# Patient Record
Sex: Male | Born: 1965 | Race: Black or African American | Hispanic: No | Smoking: Current every day smoker
Health system: Southern US, Community
[De-identification: ages and names within clinical notes are randomized; demographics above are authoritative.]

---

## 2019-08-25 ENCOUNTER — Other Ambulatory Visit: Payer: Self-pay

## 2019-08-25 ENCOUNTER — Encounter: Payer: Self-pay | Admitting: Emergency Medicine

## 2019-08-25 ENCOUNTER — Emergency Department
Admission: EM | Admit: 2019-08-25 | Discharge: 2019-08-26 | Disposition: A | Discharge status: Home / Self Care | Payer: Self-pay | Attending: Emergency Medicine | Admitting: Emergency Medicine

## 2019-08-25 DIAGNOSIS — F251 Schizoaffective disorder, depressive type: Secondary | ICD-10-CM | POA: Diagnosis present

## 2019-08-25 DIAGNOSIS — R4182 Altered mental status, unspecified: Secondary | ICD-10-CM | POA: Insufficient documentation

## 2019-08-25 DIAGNOSIS — F323 Major depressive disorder, single episode, severe with psychotic features: Secondary | ICD-10-CM | POA: Diagnosis present

## 2019-08-25 DIAGNOSIS — R440 Auditory hallucinations: Secondary | ICD-10-CM | POA: Diagnosis present

## 2019-08-25 DIAGNOSIS — F1424 Cocaine dependence with cocaine-induced mood disorder: Secondary | ICD-10-CM | POA: Insufficient documentation

## 2019-08-25 DIAGNOSIS — Z046 Encounter for general psychiatric examination, requested by authority: Secondary | ICD-10-CM | POA: Insufficient documentation

## 2019-08-25 DIAGNOSIS — F14988 Cocaine use, unspecified with other cocaine-induced disorder: Secondary | ICD-10-CM

## 2019-08-25 DIAGNOSIS — F17220 Nicotine dependence, chewing tobacco, uncomplicated: Secondary | ICD-10-CM | POA: Insufficient documentation

## 2019-08-25 DIAGNOSIS — F19951 Other psychoactive substance use, unspecified with psychoactive substance-induced psychotic disorder with hallucinations: Secondary | ICD-10-CM | POA: Diagnosis present

## 2019-08-25 DIAGNOSIS — Z20822 Contact with and (suspected) exposure to covid-19: Secondary | ICD-10-CM | POA: Insufficient documentation

## 2019-08-25 LAB — CBC WITH DIFFERENTIAL/PLATELET
Abs Immature Granulocytes: 0.02 10*3/uL (ref 0.00–0.07)
Basophils Absolute: 0 10*3/uL (ref 0.0–0.1)
Basophils Relative: 1 %
Eosinophils Absolute: 0 10*3/uL (ref 0.0–0.5)
Eosinophils Relative: 1 %
HCT: 44.5 % (ref 39.0–52.0)
Hemoglobin: 14.8 g/dL (ref 13.0–17.0)
Immature Granulocytes: 0 %
Lymphocytes Relative: 34 %
Lymphs Abs: 1.7 10*3/uL (ref 0.7–4.0)
MCH: 27.8 pg (ref 26.0–34.0)
MCHC: 33.3 g/dL (ref 30.0–36.0)
MCV: 83.5 fL (ref 80.0–100.0)
Monocytes Absolute: 0.4 10*3/uL (ref 0.1–1.0)
Monocytes Relative: 7 %
Neutro Abs: 3 10*3/uL (ref 1.7–7.7)
Neutrophils Relative %: 57 %
Platelets: 202 10*3/uL (ref 150–400)
RBC: 5.33 MIL/uL (ref 4.22–5.81)
RDW: 12.9 % (ref 11.5–15.5)
WBC: 5.2 10*3/uL (ref 4.0–10.5)
nRBC: 0 % (ref 0.0–0.2)

## 2019-08-25 LAB — COMPREHENSIVE METABOLIC PANEL
ALT: 49 U/L — ABNORMAL HIGH (ref 0–44)
AST: 80 U/L — ABNORMAL HIGH (ref 15–41)
Albumin: 4.5 g/dL (ref 3.5–5.0)
Alkaline Phosphatase: 66 U/L (ref 38–126)
Anion gap: 11 (ref 5–15)
BUN: 14 mg/dL (ref 6–20)
CO2: 27 mmol/L (ref 22–32)
Calcium: 9.1 mg/dL (ref 8.9–10.3)
Chloride: 100 mmol/L (ref 98–111)
Creatinine, Ser: 1.19 mg/dL (ref 0.61–1.24)
GFR calc Af Amer: >60 mL/min (ref >60)
GFR calc non Af Amer: >60 mL/min (ref >60)
Glucose, Bld: 115 mg/dL — ABNORMAL HIGH (ref 70–99)
Potassium: 4.1 mmol/L (ref 3.5–5.1)
Sodium: 138 mmol/L (ref 135–145)
Total Bilirubin: 1 mg/dL (ref 0.3–1.2)
Total Protein: 7.8 g/dL (ref 6.5–8.1)

## 2019-08-25 LAB — URINE DRUG SCREEN, QUALITATIVE (ARMC ONLY)
Amphetamines, Ur Screen: NOT DETECTED
Barbiturates, Ur Screen: NOT DETECTED
Benzodiazepine, Ur Scrn: NOT DETECTED
Cannabinoid 50 Ng, Ur ~~LOC~~: NOT DETECTED
Cocaine Metabolite,Ur ~~LOC~~: POSITIVE — AB
MDMA (Ecstasy)Ur Screen: NOT DETECTED
Methadone Scn, Ur: NOT DETECTED
Opiate, Ur Screen: NOT DETECTED
Phencyclidine (PCP) Ur S: NOT DETECTED
Tricyclic, Ur Screen: NOT DETECTED

## 2019-08-25 LAB — URINALYSIS, COMPLETE (UACMP) WITH MICROSCOPIC
Bacteria, UA: NONE SEEN
Bilirubin Urine: NEGATIVE
Glucose, UA: NEGATIVE mg/dL
Hgb urine dipstick: NEGATIVE
Ketones, ur: NEGATIVE mg/dL
Leukocytes,Ua: NEGATIVE
Nitrite: NEGATIVE
Protein, ur: NEGATIVE mg/dL
Specific Gravity, Urine: 1.006 (ref 1.005–1.030)
Squamous Epithelial / HPF: NONE SEEN (ref 0–5)
pH: 6 (ref 5.0–8.0)

## 2019-08-25 LAB — SALICYLATE LEVEL: Salicylate Lvl: <7 mg/dL — ABNORMAL LOW (ref 7.0–30.0)

## 2019-08-25 LAB — ETHANOL: Alcohol, Ethyl (B): <10 mg/dL (ref <10)

## 2019-08-25 LAB — LIPASE, BLOOD: Lipase: 21 U/L (ref 11–51)

## 2019-08-25 LAB — ACETAMINOPHEN LEVEL: Acetaminophen (Tylenol), Serum: <10 ug/mL — ABNORMAL LOW (ref 10–30)

## 2019-08-25 MED ORDER — LORAZEPAM 2 MG/ML IJ SOLN: 2.0000 mg | Freq: Once | INTRAMUSCULAR | Status: AC

## 2019-08-25 MED ORDER — HALOPERIDOL LACTATE 5 MG/ML IJ SOLN
INTRAMUSCULAR | Status: AC
  Filled 2019-08-25: qty 1

## 2019-08-25 MED ORDER — DIPHENHYDRAMINE HCL 50 MG/ML IJ SOLN
50.0000 mg | Freq: Once | INTRAMUSCULAR | Status: AC
  Administered 2019-08-25: 50 mg via INTRAVENOUS
  Filled 2019-08-25: qty 1

## 2019-08-25 MED ORDER — LORAZEPAM 2 MG/ML IJ SOLN
INTRAMUSCULAR | Status: AC
  Administered 2019-08-25: 2 mg via INTRAVENOUS
  Filled 2019-08-25: qty 1

## 2019-08-25 MED ORDER — HALOPERIDOL LACTATE 5 MG/ML IJ SOLN
5.0000 mg | Freq: Once | INTRAMUSCULAR | Status: AC
  Administered 2019-08-25: 5 mg via INTRAVENOUS

## 2019-08-25 MED ORDER — SODIUM CHLORIDE 0.9 % IV BOLUS
1000.0000 mL | Freq: Once | INTRAVENOUS | Status: AC
  Administered 2019-08-25: 1000 mL via INTRAVENOUS

## 2019-08-25 NOTE — ED Notes (Signed)
Pt still trying to get out of bed.  Pt grabbing at air for objects that are not there.  Unable to understand pt speech.

## 2019-08-25 NOTE — ED Notes (Signed)
Checked on pt. Remains asleep in room.  NAD. Pulse ox remains on pt.

## 2019-08-25 NOTE — ED Notes (Signed)
Per Annice Pih NP, pt to be admitted to beh med unit when bed is available.
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
## 2019-08-25 NOTE — ED Notes (Addendum)
Pt resting comfortably with eyes closed, no distress noted. Pt awaiting to be evaluated by psych.

## 2019-08-25 NOTE — ED Notes (Signed)
Pt seems paranoid, states "someone is trying to use my information".  Keeps having jerking motions to whole body in bed.  Oriented to time, place, and person.  Denies drug use.  Pt has been seen at [facility] for drug and alcohol abuse per care everywhere.  Pt has to be reminded repeatedly of directions given such as using urinal; will fall asleep then start jerking motions again.

## 2019-08-25 NOTE — ED Notes (Signed)
Report given to [employer], pt moved to 23

## 2019-08-25 NOTE — ED Notes (Signed)
Attempted to wake pt. Wakes to loud voice.  When asked pt if he knew where he was he moaned and fell back asleep. Remains in NAD.
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
## 2019-08-25 NOTE — ED Notes (Signed)
Pt still sleeping. Snoring heard, equal unlabored respirations noted.
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
## 2019-08-25 NOTE — ED Notes (Signed)
TTS in progress 

## 2019-08-25 NOTE — ED Notes (Signed)
Pt up to bathroom co sore throat and cough. Psych in to eval pt.

## 2019-08-25 NOTE — ED Notes (Signed)
Pt repeatedly getting out of bed. Will not leave yellow socks on.  Is not redirectable.  Heather RN and Animal nutritionist at bedside to assist this RN. Dr Scotty Court called to room. Officer phillips as bedside.  Pt keeps refusing to get back in bed. Appears to still be having hallucinations and unable to understand most of speech.

## 2019-08-26 DIAGNOSIS — F251 Schizoaffective disorder, depressive type: Secondary | ICD-10-CM | POA: Diagnosis present

## 2019-08-26 DIAGNOSIS — F323 Major depressive disorder, single episode, severe with psychotic features: Secondary | ICD-10-CM | POA: Diagnosis present

## 2019-08-26 DIAGNOSIS — R440 Auditory hallucinations: Secondary | ICD-10-CM | POA: Diagnosis present

## 2019-08-26 DIAGNOSIS — F19951 Other psychoactive substance use, unspecified with psychoactive substance-induced psychotic disorder with hallucinations: Secondary | ICD-10-CM | POA: Diagnosis present

## 2019-08-26 LAB — RESPIRATORY PANEL BY RT PCR (FLU A&B, COVID)
Influenza A by PCR: NEGATIVE
Influenza B by PCR: NEGATIVE
SARS Coronavirus 2 by RT PCR: NEGATIVE

## 2019-08-26 NOTE — Consult Note (Signed)
[facility]   Reason for Consult: Anxiety Referring Physician: Dr. Scotty CourtStafford Patient Identification: Kenneth DoingDavid Earl Yang MRN:  [id number] Principal Diagnosis: <principal problem not specified> Diagnosis:  Active Problems:   Major depression with psychotic features (HCC)   Schizoaffective disorder, depressive type (HCC)   Substance-induced psychotic disorder with hallucinations (HCC)   Auditory hallucinations   Total Time spent with patient: 30 minutes  Subjective: "You need to get out of here right now." Kenneth Yang is a 54 y.o. male patient [facility] via EMS voluntarily, with a past medical history of drug-induced psychosis who came in due to anxiousness, involuntary muscle twitching.  The patient was last seen at [facility] on 08/01/18 for a physical assessment.  The patient was not cooperative during his assessment.  He was challenging to understand and inconsistent with his information.  The patient denies drug use "somebody put something in my drink."    When asked why he presents to the emergency department, he responds, "I don't know." He also states that he is unaware of how he got to the ER. The patient was able to identify that he is very anxious. He "states I'm having a problem breathing and swallowing." He denies any history of medication management currently.  The patient did admit that he has spend some time inpatient at [facility].  He also disclosed that he was once prescribed Risperdal but could not elaborate on the dosage or any other medication.  He did admit that he often feels as if people are watching him or out to get him. He shares that his paranoia is usually only when he is intoxicated.  The patient was seen face-to-face by this provider; chart reviewed and consulted with Dr. Derrill KayGoodman on 08/25/2019 due to the patient's care. It was discussed with the EDP that the patient does meet the criteria to be  admitted to the psychiatric inpatient unit.  The patient is alert and oriented x 3, angry, irritated, uncooperative, and mood-congruent with affect on evaluation.  The patient does appear to be responding to internal and external stimuli. He is presenting with some delusional thinking. The patient denies auditory or visual hallucinations. The patient denies suicidal, homicidal, or self-harm ideations. The patient is presenting with psychotic and paranoid behaviors. During an encounter with the patient, he refused to answer most questions raised to him.  Plan: The patient is a safety risk to self and does require psychiatric inpatient admission for stabilization and treatment. HPI: Per Dr. Scotty CourtStafford: Kenneth Yang is a 54 y.o. male with a past medical history of drug-induced psychosis who comes to the ED by EMS due to anxiousness, involuntary muscle twitching.  States that he was hanging out with some new friends last night, and thinks they might have slipped him something.  He is evasive with questioning, denies drug use but reports high suspicion of being given drugs.  When asked for more details he says "I do not want to get him in trouble."  Denies any focal pain.  Reports an alcohol use and smoking.  Denies shortness of breath or headache.   Past Psychiatric History:   Risk to Self: Suicidal Ideation: No Suicidal Intent: No Is patient at risk for suicide?: No, but patient needs Medical Clearance Suicidal Plan?: No Access to Means: No How many times?: 0 Other Self Harm Risks: drug use  Triggers for Past Attempts: None known Intentional Self Injurious Behavior: None Risk to Others: Homicidal  Ideation: No Thoughts of Harm to Others: No Current Homicidal Intent: No Current Homicidal Plan: No Access to Homicidal Means: No History of harm to others?: No Does patient have access to weapons?: No Criminal Charges Pending?: No Prior Inpatient Therapy:   Prior Outpatient Therapy: Prior  Outpatient Therapy: No Does patient have an ACCT team?: No Does patient have Intensive In-House Services?  : No Does patient have [facility] services? : No Does patient have P4CC services?: No  Past Medical History: No past medical history on file.  Family History: No family history on file. Family Psychiatric  History: None provided Social History:  Social History   Substance and Sexual Activity  Alcohol Use Yes     Social History   Substance and Sexual Activity  Drug Use Never    Social History   Socioeconomic History  . Marital status: Single    Spouse name: Not on file  . Number of children: Not on file  . Years of education: Not on file  . Highest education level: Not on file  Occupational History  . Not on file  Tobacco Use  . Smoking status: Current Every Day Smoker    Types: E-cigarettes  . Smokeless tobacco: Never Used  Substance and Sexual Activity  . Alcohol use: Yes  . Drug use: Never  . Sexual activity: Not on file  Other Topics Concern  . Not on file  Social History Narrative  . Not on file   Social Determinants of Health   Financial Resource Strain:   . Difficulty of Paying Living Expenses:   Food Insecurity:   . Worried About Programme researcher, broadcasting/film/video in the Last Year:   . Barista in the Last Year:   Transportation Needs:   . Freight forwarder (Medical):   Marland Kitchen Lack of Transportation (Non-Medical):   Physical Activity:   . Days of Exercise per Week:   . Minutes of Exercise per Session:   Stress:   . Feeling of Stress :   Social Connections:   . Frequency of Communication with Friends and Family:   . Frequency of Social Gatherings with Friends and Family:   . Attends Religious Services:   . Active Member of Clubs or Organizations:   . Attends Banker Meetings:   Marland Kitchen Marital Status:    Additional Social History:    Allergies:   Allergies  Allergen Reactions  . Risperidone Other (See Comments)    Tongue EPS, drooling     Labs:  Results for orders placed or performed during the hospital encounter of 08/25/19 (from the past 48 hour(s))  Acetaminophen level     Status: Abnormal   Collection Time: 08/25/19  8:02 AM  Result Value Ref Range   Acetaminophen (Tylenol), Serum <10 (L) 10 - 30 ug/mL    Comment: (NOTE) Therapeutic concentrations vary significantly. A range of 10-30 ug/mL  may be an effective concentration for many patients. However, some  are best treated at concentrations outside of this range. Acetaminophen concentrations >150 ug/mL at 4 hours after ingestion  and >50 ug/mL at 12 hours after ingestion are often associated with  toxic reactions. Performed at [facility], [address] [place], [place] [address]   Comprehensive metabolic panel     Status: Abnormal   Collection Time: 08/25/19  8:02 AM  Result Value Ref Range   Sodium 138 135 - 145 mmol/L   Potassium 4.1 3.5 - 5.1 mmol/L   Chloride 100 98 -  111 mmol/L   CO2 27 22 - 32 mmol/L   Glucose, Bld 115 (H) 70 - 99 mg/dL    Comment: Glucose reference range applies only to samples taken after fasting for at least 8 hours.   BUN 14 6 - 20 mg/dL   Creatinine, Ser 7.06 0.61 - 1.24 mg/dL   Calcium 9.1 8.9 - 23.7 mg/dL   Total Protein 7.8 6.5 - 8.1 g/dL   Albumin 4.5 3.5 - 5.0 g/dL   AST 80 (H) 15 - 41 U/L   ALT 49 (H) 0 - 44 U/L   Alkaline Phosphatase 66 38 - 126 U/L   Total Bilirubin 1.0 0.3 - 1.2 mg/dL   GFR calc non Af Amer >60 >60 mL/min   GFR calc Af Amer >60 >60 mL/min   Anion gap 11 5 - 15    Comment: Performed at [facility], [address] [place], [place] [address]  Ethanol     Status: None   Collection Time: 08/25/19  8:02 AM  Result Value Ref Range   Alcohol, Ethyl (B) <10 <10 mg/dL    Comment: (NOTE) Lowest detectable limit for serum alcohol is 10 mg/dL. For medical purposes only. Performed at [facility], [address] [place], [place] [address]   Lipase, blood     Status:  None   Collection Time: 08/25/19  8:02 AM  Result Value Ref Range   Lipase 21 11 - 51 U/L    Comment: Performed at [facility], [address] [place], [place] [address]  Salicylate level     Status: Abnormal   Collection Time: 08/25/19  8:02 AM  Result Value Ref Range   Salicylate Lvl <7.0 (L) 7.0 - 30.0 mg/dL    Comment: Performed at [facility], [address] [place], [place] [address]  CBC with Differential     Status: None   Collection Time: 08/25/19  8:02 AM  Result Value Ref Range   WBC 5.2 4.0 - 10.5 K/uL   RBC 5.33 4.22 - 5.81 MIL/uL   Hemoglobin 14.8 13.0 - 17.0 g/dL   HCT 94.8 54.6 - 27.0 %   MCV 83.5 80.0 - 100.0 fL   MCH 27.8 26.0 - 34.0 pg   MCHC 33.3 30.0 - 36.0 g/dL   RDW 35.0 09.3 - 81.8 %   Platelets 202 150 - 400 K/uL   nRBC 0.0 0.0 - 0.2 %   Neutrophils Relative % 57 %   Neutro Abs 3.0 1.7 - 7.7 K/uL   Lymphocytes Relative 34 %   Lymphs Abs 1.7 0.7 - 4.0 K/uL   Monocytes Relative 7 %   Monocytes Absolute 0.4 0.1 - 1.0 K/uL   Eosinophils Relative 1 %   Eosinophils Absolute 0.0 0.0 - 0.5 K/uL   Basophils Relative 1 %   Basophils Absolute 0.0 0.0 - 0.1 K/uL   Immature Granulocytes 0 %   Abs Immature Granulocytes 0.02 0.00 - 0.07 K/uL    Comment: Performed at [facility], [address] [place], [place] [address]  Urinalysis, Complete w Microscopic     Status: Abnormal   Collection Time: 08/25/19  8:45 AM  Result Value Ref Range   Color, Urine YELLOW (A) YELLOW   APPearance CLEAR (A) CLEAR   Specific Gravity, Urine 1.006 1.005 - 1.030   pH 6.0 5.0 - 8.0   Glucose, UA NEGATIVE NEGATIVE mg/dL   Hgb urine dipstick NEGATIVE NEGATIVE   Bilirubin Urine NEGATIVE NEGATIVE  Ketones, ur NEGATIVE NEGATIVE mg/dL   Protein, ur NEGATIVE NEGATIVE mg/dL   Nitrite NEGATIVE NEGATIVE   Leukocytes,Ua NEGATIVE NEGATIVE   RBC / HPF 0-5 0 - 5 RBC/hpf   WBC, UA 0-5 0 - 5 WBC/hpf   Bacteria, UA NONE SEEN NONE SEEN   Squamous Epithelial  / LPF NONE SEEN 0 - 5   Mucus PRESENT     Comment: Performed at [facility], [address] [place], [place] [address]  Urine Drug Screen, Qualitative     Status: Abnormal   Collection Time: 08/25/19  8:45 AM  Result Value Ref Range   Tricyclic, Ur Screen NONE DETECTED NONE DETECTED   Amphetamines, Ur Screen NONE DETECTED NONE DETECTED   MDMA (Ecstasy)Ur Screen NONE DETECTED NONE DETECTED   Cocaine Metabolite,Ur [place] POSITIVE (A) NONE DETECTED   Opiate, Ur Screen NONE DETECTED NONE DETECTED   Phencyclidine (PCP) Ur S NONE DETECTED NONE DETECTED   Cannabinoid 50 Ng, Ur [place] NONE DETECTED NONE DETECTED   Barbiturates, Ur Screen NONE DETECTED NONE DETECTED   Benzodiazepine, Ur Scrn NONE DETECTED NONE DETECTED   Methadone Scn, Ur NONE DETECTED NONE DETECTED    Comment: (NOTE) Tricyclics + metabolites, urine    Cutoff 1000 ng/mL Amphetamines + metabolites, urine  Cutoff 1000 ng/mL MDMA (Ecstasy), urine              Cutoff 500 ng/mL Cocaine Metabolite, urine          Cutoff 300 ng/mL Opiate + metabolites, urine        Cutoff 300 ng/mL Phencyclidine (PCP), urine         Cutoff 25 ng/mL Cannabinoid, urine                 Cutoff 50 ng/mL Barbiturates + metabolites, urine  Cutoff 200 ng/mL Benzodiazepine, urine              Cutoff 200 ng/mL Methadone, urine                   Cutoff 300 ng/mL The urine drug screen provides only a preliminary, unconfirmed analytical test result and should not be used for non-medical purposes. Clinical consideration and professional judgment should be applied to any positive drug screen result due to possible interfering substances. A more specific alternate chemical method must be used in order to obtain a confirmed analytical result. Gas chromatography / mass spectrometry (GC/MS) is the preferred confirmat ory method. Performed at [facility], [address] [place], [place] [address]     No current facility-administered medications  for this encounter.   No current outpatient medications on file.    Musculoskeletal: Strength & Muscle Tone: within normal limits Gait & Station: normal Patient leans: N/A  Psychiatric Specialty Exam: Physical Exam  Nursing note and vitals reviewed. Constitutional: He is oriented to person, place, and time. He appears well-developed and well-nourished.  Cardiovascular: Normal rate.  Respiratory: Effort normal.  Musculoskeletal:        General: Normal range of motion.     Cervical back: Normal range of motion and neck supple.  Neurological: He is alert and oriented to person, place, and time.    Review of Systems  Psychiatric/Behavioral: Positive for agitation, behavioral problems, confusion and hallucinations. The patient is nervous/anxious.   All other systems reviewed and are negative.   Blood pressure 114/88, pulse 80, temperature 98.5 F (36.9 C), temperature source Oral, resp. rate 18, weight 81.6 kg, SpO2 93 %.There is no  height or weight on file to calculate BMI.  General Appearance: Bizarre and Guarded  Eye Contact:  Poor  Speech:  Pressured  Volume:  Increased  Mood:  Angry, Anxious, Euphoric and Irritable  Affect:  Congruent, Inappropriate and Full Range  Thought Process:  Disorganized  Orientation:  Full (Time, Place, and Person)  Thought Content:  Illogical, Delusions and Paranoid Ideation  Suicidal Thoughts:  No  Homicidal Thoughts:  No  Memory:  Immediate;   Poor Recent;   Poor Remote;   Poor  Judgement:  Impaired  Insight:  Lacking  Psychomotor Activity:  Increased  Concentration:  Concentration: Poor and Attention Span: Poor  Recall:  Poor  Fund of Knowledge:  Poor  Language:  Fair  Akathisia:  Negative  Handed:  Right  AIMS (if indicated):     Assets:  Communication Skills Desire for Improvement Financial Resources/Insurance Housing Resilience Social Support  ADL's:  Intact  Cognition:  Impaired,  Mild  Sleep:    Okay     Treatment Plan  Summary: Plan Patient meets criteria for psychiatric inpatient admission.  Disposition: Recommend psychiatric Inpatient admission when medically cleared. Supportive therapy provided about ongoing stressors.  Gillermo Murdoch, NP 08/26/2019 3:53 AM

## 2019-08-26 NOTE — Consult Note (Signed)
[facility] Face-to-Face Psychiatry Consult   Reason for Consult: Anxiety Referring Physician: Dr. Scotty Court Patient Identification: Kenneth Yang MRN:  [id number] Principal Diagnosis: <principal problem not specified> Diagnosis:  Active Problems:   Major depression with psychotic features (HCC)   Schizoaffective disorder, depressive type (HCC)   Substance-induced psychotic disorder with hallucinations (HCC)   Auditory hallucinations     Patient reassessed on 08/26/2019:  Patient calm and cooperative during the course of interview.  He is able to reasonably explain his presentation is due to cocaine intoxication.  Patient states that sometimes when he uses he will feel paranoid and occasionally experience hallucinations.  He denies any hallucinations today.  He denies ever having the symptoms when sober.  Patient is reporting stable mood and denies any suicidal or homicidal ideation.  No psychosis evident at this time.  Due to patient's current status we will provide him with outpatient substance abuse resources and discharge back to the community.  Original note from NP as follows " Total Time spent with patient: 30 minutes  Subjective: "You need to get out of here right now." Kenneth Yang is a 54 y.o. male patient presented to [facility] ED via EMS voluntarily, with a past medical history of drug-induced psychosis who came in due to anxiousness, involuntary muscle twitching.  The patient was last seen at [facility] on 08/01/18 for a physical assessment.  The patient was not cooperative during his assessment.  He was challenging to understand and inconsistent with his information.  The patient denies drug use "somebody put something in my drink."    When asked why he presents to the emergency department, he responds, "I don't know." He also states that he is unaware of how he got to the ER. The patient was able to identify that he is very anxious. He "states I'm having a problem breathing and  swallowing." He denies any history of medication management currently.  The patient did admit that he has spend some time inpatient at [facility] in 2014.  He also disclosed that he was once prescribed Risperdal but could not elaborate on the dosage or any other medication.  He did admit that he often feels as if people are watching him or out to get him. He shares that his paranoia is usually only when he is intoxicated.  The patient was seen face-to-face by this provider; chart reviewed and consulted with Dr. Derrill Kay on 08/25/2019 due to the patient's care. It was discussed with the EDP that the patient does meet the criteria to be admitted to the psychiatric inpatient unit.  The patient is alert and oriented x 3, angry, irritated, uncooperative, and mood-congruent with affect on evaluation.  The patient does appear to be responding to internal and external stimuli. He is presenting with some delusional thinking. The patient denies auditory or visual hallucinations. The patient denies suicidal, homicidal, or self-harm ideations. The patient is presenting with psychotic and paranoid behaviors. During an encounter with the patient, he refused to answer most questions raised to him.  Plan: The patient is a safety risk to self and does require psychiatric inpatient admission for stabilization and treatment. HPI: Per Dr. Scotty Court: Kenneth Yang is a 54 y.o. male with a past medical history of drug-induced psychosis who comes to the ED by EMS due to anxiousness, involuntary muscle twitching.  States that he was hanging out with some new friends last night, and thinks they might have slipped him something.  He is evasive  with questioning, denies drug use but reports high suspicion of being given drugs.  When asked for more details he says "I do not want to get him in trouble."  Denies any focal pain.  Reports an alcohol use and smoking.  Denies shortness of breath or  headache. "  Past Psychiatric History:   Risk to Self: Suicidal Ideation: No Suicidal Intent: No Is patient at risk for suicide?: No, but patient needs Medical Clearance Suicidal Plan?: No Access to Means: No How many times?: 0 Other Self Harm Risks: drug use  Triggers for Past Attempts: None known Intentional Self Injurious Behavior: None Risk to Others: Homicidal Ideation: No Thoughts of Harm to Others: No Current Homicidal Intent: No Current Homicidal Plan: No Access to Homicidal Means: No History of harm to others?: No Does patient have access to weapons?: No Criminal Charges Pending?: No Prior Inpatient Therapy:   Prior Outpatient Therapy: Prior Outpatient Therapy: No Does patient have an ACCT team?: No Does patient have Intensive In-House Services?  : No Does patient have [facility] services? : No Does patient have P4CC services?: No  Past Medical History: No past medical history on file.  Family History: No family history on file. Family Psychiatric  History: None provided Social History:  Social History   Substance and Sexual Activity  Alcohol Use Yes     Social History   Substance and Sexual Activity  Drug Use Never    Social History   Socioeconomic History  . Marital status: Single    Spouse name: Not on file  . Number of children: Not on file  . Years of education: Not on file  . Highest education level: Not on file  Occupational History  . Not on file  Tobacco Use  . Smoking status: Current Every Day Smoker    Types: E-cigarettes  . Smokeless tobacco: Never Used  Substance and Sexual Activity  . Alcohol use: Yes  . Drug use: Never  . Sexual activity: Not on file  Other Topics Concern  . Not on file  Social History Narrative  . Not on file   Social Determinants of Health   Financial Resource Strain:   . Difficulty of Paying Living Expenses:   Food Insecurity:   . Worried About Charity fundraiser in the Last Year:   . Arboriculturist  in the Last Year:   Transportation Needs:   . Film/video editor (Medical):   Marland Kitchen Lack of Transportation (Non-Medical):   Physical Activity:   . Days of Exercise per Week:   . Minutes of Exercise per Session:   Stress:   . Feeling of Stress :   Social Connections:   . Frequency of Communication with Friends and Family:   . Frequency of Social Gatherings with Friends and Family:   . Attends Religious Services:   . Active Member of Clubs or Organizations:   . Attends Archivist Meetings:   Marland Kitchen Marital Status:    Additional Social History:    Allergies:   Allergies  Allergen Reactions  . Risperidone Other (See Comments)    Tongue EPS, drooling    Labs:  Results for orders placed or performed during the hospital encounter of 08/25/19 (from the past 48 hour(s))  Acetaminophen level     Status: Abnormal   Collection Time: 08/25/19  8:02 AM  Result Value Ref Range   Acetaminophen (Tylenol), Serum <10 (L) 10 - 30 ug/mL    Comment: (NOTE) Therapeutic concentrations  vary significantly. A range of 10-30 ug/mL  may be an effective concentration for many patients. However, some  are best treated at concentrations outside of this range. Acetaminophen concentrations >150 ug/mL at 4 hours after ingestion  and >50 ug/mL at 12 hours after ingestion are often associated with  toxic reactions. Performed at [facility], [address] [place], [place] [address]   Comprehensive metabolic panel     Status: Abnormal   Collection Time: 08/25/19  8:02 AM  Result Value Ref Range   Sodium 138 135 - 145 mmol/L   Potassium 4.1 3.5 - 5.1 mmol/L   Chloride 100 98 - 111 mmol/L   CO2 27 22 - 32 mmol/L   Glucose, Bld 115 (H) 70 - 99 mg/dL    Comment: Glucose reference range applies only to samples taken after fasting for at least 8 hours.   BUN 14 6 - 20 mg/dL   Creatinine, Ser 4.65 0.61 - 1.24 mg/dL   Calcium 9.1 8.9 - 03.5 mg/dL   Total Protein 7.8 6.5 - 8.1 g/dL    Albumin 4.5 3.5 - 5.0 g/dL   AST 80 (H) 15 - 41 U/L   ALT 49 (H) 0 - 44 U/L   Alkaline Phosphatase 66 38 - 126 U/L   Total Bilirubin 1.0 0.3 - 1.2 mg/dL   GFR calc non Af Amer >60 >60 mL/min   GFR calc Af Amer >60 >60 mL/min   Anion gap 11 5 - 15    Comment: Performed at [facility], [address] [place], [place] [address]  Ethanol     Status: None   Collection Time: 08/25/19  8:02 AM  Result Value Ref Range   Alcohol, Ethyl (B) <10 <10 mg/dL    Comment: (NOTE) Lowest detectable limit for serum alcohol is 10 mg/dL. For medical purposes only. Performed at [facility], [address] [place], [place] [address]   Lipase, blood     Status: None   Collection Time: 08/25/19  8:02 AM  Result Value Ref Range   Lipase 21 11 - 51 U/L    Comment: Performed at [facility], [address] [place], [place] [address]  Salicylate level     Status: Abnormal   Collection Time: 08/25/19  8:02 AM  Result Value Ref Range   Salicylate Lvl <7.0 (L) 7.0 - 30.0 mg/dL    Comment: Performed at [facility], [address] [place], [place] [address]  CBC with Differential     Status: None   Collection Time: 08/25/19  8:02 AM  Result Value Ref Range   WBC 5.2 4.0 - 10.5 K/uL   RBC 5.33 4.22 - 5.81 MIL/uL   Hemoglobin 14.8 13.0 - 17.0 g/dL   HCT 67.5 91.6 - 38.4 %   MCV 83.5 80.0 - 100.0 fL   MCH 27.8 26.0 - 34.0 pg   MCHC 33.3 30.0 - 36.0 g/dL   RDW 66.5 99.3 - 57.0 %   Platelets 202 150 - 400 K/uL   nRBC 0.0 0.0 - 0.2 %   Neutrophils Relative % 57 %   Neutro Abs 3.0 1.7 - 7.7 K/uL   Lymphocytes Relative 34 %   Lymphs Abs 1.7 0.7 - 4.0 K/uL   Monocytes Relative 7 %   Monocytes Absolute 0.4 0.1 - 1.0 K/uL   Eosinophils Relative 1 %   Eosinophils Absolute 0.0 0.0 - 0.5 K/uL   Basophils Relative 1 %   Basophils Absolute  0.0 0.0 - 0.1 K/uL   Immature Granulocytes 0 %   Abs Immature Granulocytes 0.02 0.00 - 0.07 K/uL    Comment: Performed at  [facility], [address] [place], [place] [address]  Urinalysis, Complete w Microscopic     Status: Abnormal   Collection Time: 08/25/19  8:45 AM  Result Value Ref Range   Color, Urine YELLOW (A) YELLOW   APPearance CLEAR (A) CLEAR   Specific Gravity, Urine 1.006 1.005 - 1.030   pH 6.0 5.0 - 8.0   Glucose, UA NEGATIVE NEGATIVE mg/dL   Hgb urine dipstick NEGATIVE NEGATIVE   Bilirubin Urine NEGATIVE NEGATIVE   Ketones, ur NEGATIVE NEGATIVE mg/dL   Protein, ur NEGATIVE NEGATIVE mg/dL   Nitrite NEGATIVE NEGATIVE   Leukocytes,Ua NEGATIVE NEGATIVE   RBC / HPF 0-5 0 - 5 RBC/hpf   WBC, UA 0-5 0 - 5 WBC/hpf   Bacteria, UA NONE SEEN NONE SEEN   Squamous Epithelial / LPF NONE SEEN 0 - 5   Mucus PRESENT     Comment: Performed at [facility], [address], [place], [place] [address]  Urine Drug Screen, Qualitative     Status: Abnormal   Collection Time: 08/25/19  8:45 AM  Result Value Ref Range   Tricyclic, Ur Screen NONE DETECTED NONE DETECTED   Amphetamines, Ur Screen NONE DETECTED NONE DETECTED   MDMA (Ecstasy)Ur Screen NONE DETECTED NONE DETECTED   Cocaine Metabolite,Ur [place] POSITIVE (A) NONE DETECTED   Opiate, Ur Screen NONE DETECTED NONE DETECTED   Phencyclidine (PCP) Ur S NONE DETECTED NONE DETECTED   Cannabinoid 50 Ng, Ur [place] NONE DETECTED NONE DETECTED   Barbiturates, Ur Screen NONE DETECTED NONE DETECTED   Benzodiazepine, Ur Scrn NONE DETECTED NONE DETECTED   Methadone Scn, Ur NONE DETECTED NONE DETECTED    Comment: (NOTE) Tricyclics + metabolites, urine    Cutoff 1000 ng/mL Amphetamines + metabolites, urine  Cutoff 1000 ng/mL MDMA (Ecstasy), urine              Cutoff 500 ng/mL Cocaine Metabolite, urine          Cutoff 300 ng/mL Opiate + metabolites, urine        Cutoff 300 ng/mL Phencyclidine (PCP), urine         Cutoff 25 ng/mL Cannabinoid, urine                 Cutoff 50 ng/mL Barbiturates + metabolites, urine  Cutoff 200 ng/mL Benzodiazepine, urine               Cutoff 200 ng/mL Methadone, urine                   Cutoff 300 ng/mL The urine drug screen provides only a preliminary, unconfirmed analytical test result and should not be used for non-medical purposes. Clinical consideration and professional judgment should be applied to any positive drug screen result due to possible interfering substances. A more specific alternate chemical method must be used in order to obtain a confirmed analytical result. Gas chromatography / mass spectrometry (GC/MS) is the preferred confirmat ory method. Performed at [facility], [address] [place], [place] [address]   Respiratory Panel by RT PCR (Flu A&B, Covid) - Nasopharyngeal Swab     Status: None   Collection Time: 08/26/19  4:18 AM   Specimen: Nasopharyngeal Swab  Result Value Ref Range   SARS Coronavirus 2 by RT PCR NEGATIVE NEGATIVE    Comment: (NOTE) SARS-CoV-2  target nucleic acids are NOT DETECTED. The SARS-CoV-2 RNA is generally detectable in upper respiratoy specimens during the acute phase of infection. The lowest concentration of SARS-CoV-2 viral copies this assay can detect is 131 copies/mL. A negative result does not preclude SARS-Cov-2 infection and should not be used as the sole basis for treatment or other patient management decisions. A negative result may occur with  improper specimen collection/handling, submission of specimen other than nasopharyngeal swab, presence of viral mutation(s) within the areas targeted by this assay, and inadequate number of viral copies (<131 copies/mL). A negative result must be combined with clinical observations, patient history, and epidemiological information. The expected result is Negative. Fact Sheet for Patients:  https://www.moore.com/https://www.fda.gov/media/142436/download Fact Sheet for Healthcare Providers:  https://www.young.biz/https://www.fda.gov/media/142435/download This test is not yet ap proved or cleared by the [place] FDA and  has been  authorized for detection and/or diagnosis of SARS-CoV-2 by FDA under an Emergency Use Authorization (EUA). This EUA will remain  in effect (meaning this test can be used) for the duration of the COVID-19 declaration under Section 564(b)(1) of the Act, 21 U.S.C. section 360bbb-3(b)(1), unless the authorization is terminated or revoked sooner.    Influenza A by PCR NEGATIVE NEGATIVE   Influenza B by PCR NEGATIVE NEGATIVE    Comment: (NOTE) The Xpert Xpress SARS-CoV-2/FLU/RSV assay is intended as an aid in  the diagnosis of influenza from Nasopharyngeal swab specimens and  should not be used as a sole basis for treatment. Nasal washings and  aspirates are unacceptable for Xpert Xpress SARS-CoV-2/FLU/RSV  testing. Fact Sheet for Patients: https://www.moore.com/https://www.fda.gov/media/142436/download Fact Sheet for Healthcare Providers: https://www.young.biz/https://www.fda.gov/media/142435/download This test is not yet approved or cleared by the [place] FDA and  has been authorized for detection and/or diagnosis of SARS-CoV-2 by  FDA under an Emergency Use Authorization (EUA). This EUA will remain  in effect (meaning this test can be used) for the duration of the  Covid-19 declaration under Section 564(b)(1) of the Act, 21  U.S.C. section 360bbb-3(b)(1), unless the authorization is  terminated or revoked. Performed at [facility], [address] [place], [place] [address]     No current facility-administered medications for this encounter.   No current outpatient medications on file.    Musculoskeletal: Strength & Muscle Tone: within normal limits Gait & Station: normal Patient leans: N/A  Psychiatric Specialty Exam: Physical Exam  Nursing note and vitals reviewed. Constitutional: He is oriented to person, place, and time. He appears well-developed and well-nourished.  Cardiovascular: Normal rate.  Respiratory: Effort normal.  Musculoskeletal:        General: Normal range of motion.     Cervical  back: Normal range of motion and neck supple.  Neurological: He is alert and oriented to person, place, and time.    Review of Systems  Psychiatric/Behavioral: Positive for agitation, behavioral problems, confusion and hallucinations. The patient is nervous/anxious.   All other systems reviewed and are negative.   Blood pressure 114/88, pulse 80, temperature 98.5 F (36.9 C), temperature source Oral, resp. rate 18, weight 81.6 kg, SpO2 93 %.There is no height or weight on file to calculate BMI.  General Appearance: Bizarre and Guarded  Eye Contact:  Poor  Speech:  Pressured  Volume:  Increased  Mood:  Angry, Anxious, Euphoric and Irritable  Affect:  Congruent, Inappropriate and Full Range  Thought Process:  Disorganized  Orientation:  Full (Time, Place, and Person)  Thought Content:  Illogical, Delusions and Paranoid Ideation  Suicidal Thoughts:  No  Homicidal Thoughts:  No  Memory:  Immediate;   Poor Recent;   Poor Remote;   Poor  Judgement:  Impaired  Insight:  Lacking  Psychomotor Activity:  Increased  Concentration:  Concentration: Poor and Attention Span: Poor  Recall:  Poor  Fund of Knowledge:  Poor  Language:  Fair  Akathisia:  Negative  Handed:  Right  AIMS (if indicated):     Assets:  Communication Skills Desire for Improvement Financial Resources/Insurance Housing Resilience Social Support  ADL's:  Intact  Cognition:  Impaired,  Mild  Sleep:    Okay     Treatment Plan Summary: 54 year old male with history of polysubstance abuse presents intoxicated on cocaine.  During his intoxication patient was agitated complaining of hallucinations.  After given some time to sober up and spending the night in the emergency department the patient woke up calm and collected.  At this point he was denying any symptoms of psychosis and is also denying any mood symptoms including  SI.  Diagnosis: Cocaine abuse, polysubstance abuse  Disposition: No evidence of imminent risk to  self or others at present.   Patient does not meet criteria for psychiatric inpatient admission. Supportive therapy provided about ongoing stressors. Discussed crisis plan, support from social network, calling 911, coming to the Emergency Department, and calling Suicide Hotline.  Clement Sayres, MD 08/26/2019 11:31 AM

## 2019-08-26 NOTE — Progress Notes (Signed)
Pt meets inpatient criteria. Referral information has been sent to the following hospitals:  [facility] [place], [place], [place] Disposition [facility]/TTS [phone]

## 2019-08-26 NOTE — ED Notes (Signed)
Patient given a cup of water upon request.

## 2019-08-26 NOTE — ED Notes (Signed)
Patient speaking with the psychiatrist

## 2019-08-26 NOTE — ED Provider Notes (Signed)
Patient has been seen and cleared by psychiatry for discharge.   Emily Filbert, MD 08/26/19 1125

## 2019-08-26 NOTE — ED Notes (Signed)
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 Writer informed him that he may be admitted inpatient but will speak with the doctor.

## 2019-08-26 NOTE — ED Notes (Signed)
Pt breakfast tray set at bedside. 

## 2019-08-26 NOTE — ED Provider Notes (Signed)
The patient has been placed in psychiatric observation due to the need to provide a safe environment for the patient while obtaining psychiatric consultation and evaluation, as well as ongoing medical and medication management to treat the patient's condition.  The patient has not been placed under full IVC at this time.    Don Perking, [place], MD 08/26/19 [phone]

## 2019-08-26 NOTE — ED Notes (Signed)
Patient discharged home, patient received discharge papers. Patient received belongings and verbalized he has received all of his belongings. Patient appropriate and cooperative, Denies SI/HI AVH. Vital signs taken. NAD noted. 

## 2019-08-26 NOTE — BH Assessment (Signed)
Assessment Note  Kenneth Yang is an 54 y.o. male. Who presents with past medical history of drug-induced psychosis who comes to the ED by EMS due to anxiousness, involuntary muscle twitching. Pt awakened to voice and was agreeable to complete assessment. Pt was difficult to understand much of the time and timelines were inconsistent.  Pt has acknowledged drug use  although pt is unable or unwilling to elaborate. When asked if he is requesting assistance with arranging rehab or detox the patient states "I don't know.". Pt  proved to be a poor historian. The following information is what the clinician was able to ascertain from the pt;  Patient presented as flat and constricted. He states that he walked from another city and then decided to catch a lyft. When asked why he present to the emergency department he responds with "I don't know." He also states that he is unaware as to how he got to the ER. Patient was able to identify that he is very anxious. He "states I'm having a problem breathing and swallowing."  He reports an inability toobtain restful sleep reporting sleeping only 3 hours per night. He states that he lives alone and has limited to no supports. He denies any history of medication management. patient confirmed that he often feels as if people are watching him or out to get him. He shares that this paranoia is often only when he is intoxicated. He denied access to any weapons.Pt presenting with impaired insight, judgment and impulse control, further evaluation is recommended.  Diagnosis: Substance induced mood disorder   Past Medical History: No past medical history on file.   Family History: No family history on file.  Social History:  reports that he has been smoking e-cigarettes. He has never used smokeless tobacco. He reports current alcohol use. He reports that he does not use drugs.  Additional Social History:  Alcohol / Drug Use Pain Medications: SEE PTA Prescriptions: SEE  PTA Over the Counter: SEE PTA History of alcohol / drug use?: Yes Longest period of sobriety (when/how long): Unknown  CIWA: CIWA-Ar BP: 114/88 Pulse Rate: 80 COWS:    Allergies:  Allergies  Allergen Reactions  . Risperidone Other (See Comments)    Tongue EPS, drooling    Home Medications: (Not in a hospital admission)   OB/GYN Status:  No LMP for male patient.  General Assessment Data Location of Assessment: [facility] ED TTS Assessment: In system Is this a Tele or Face-to-Face Assessment?: Tele Assessment Is this an Initial Assessment or a Re-assessment for this encounter?: Initial Assessment Patient Accompanied by:: N/A Language Other than English: No Living Arrangements: Other (Comment) What gender do you identify as?: Male Marital status: Single Pregnancy Status: No Living Arrangements: Alone Can pt return to current living arrangement?: Yes Admission Status: Involuntary Petitioner: Other Is patient capable of signing voluntary admission?: No Referral Source: Other Insurance type: none  Medical Screening Exam ([place]) Medical Exam completed: Yes  Crisis Care Plan Living Arrangements: Alone Legal Guardian: (none) Name of Psychiatrist: none Name of Therapist: none  Education Status Is patient currently in school?: No Is the patient employed, unemployed or receiving disability?: Unemployed  Risk to self with the past 6 months Suicidal Ideation: No Has patient been a risk to self within the past 6 months prior to admission? : No Suicidal Intent: No Has patient had any suicidal intent within the past 6 months prior to admission? : No Is patient at risk for suicide?: No, but patient  needs Medical Clearance Suicidal Plan?: No Has patient had any suicidal plan within the past 6 months prior to admission? : No Access to Means: No Previous Attempts/Gestures: No How many times?: 0 Other Self Harm Risks: drug use  Triggers for Past Attempts: None  known Intentional Self Injurious Behavior: None Family Suicide History: No Recent stressful life event(s): Other (Comment)(UTA) Persecutory voices/beliefs?: Yes Depression: No Substance abuse history and/or treatment for substance abuse?: Yes Suicide prevention information given to non-admitted patients: Not applicable  Risk to Others within the past 6 months Homicidal Ideation: No Does patient have any lifetime risk of violence toward others beyond the six months prior to admission? : No Thoughts of Harm to Others: No Current Homicidal Intent: No Current Homicidal Plan: No Access to Homicidal Means: No History of harm to others?: No Does patient have access to weapons?: No Criminal Charges Pending?: No Is patient on probation?: No  Psychosis Hallucinations: None noted Delusions: Persecutory, Unspecified  Mental Status Report Appearance/Hygiene: In scrubs Eye Contact: Poor Motor Activity: Unable to assess Speech: Pressured Level of Consciousness: Alert Mood: Sullen Affect: Blunted Anxiety Level: None Thought Processes: Thought Blocking Judgement: Impaired Orientation: Time, Place, Person, Situation Obsessive Compulsive Thoughts/Behaviors: Unable to Assess  Cognitive Functioning Concentration: Poor Memory: Remote Intact, Recent Intact Is patient IDD: No Insight: Poor Impulse Control: Poor Appetite: Fair Have you had any weight changes? : No Change Sleep: Decreased Total Hours of Sleep: 3 Vegetative Symptoms: Unable to Assess  ADLScreening [facility] Assessment Services) Patient's cognitive ability adequate to safely complete daily activities?: Yes Patient able to express need for assistance with ADLs?: Yes Independently performs ADLs?: Yes (appropriate for developmental age)     Prior Outpatient Therapy Prior Outpatient Therapy: No Does patient have an ACCT team?: No Does patient have Intensive In-House Services?  : No Does patient have [facility] services? :  No Does patient have P4CC services?: No  ADL Screening (condition at time of admission) Patient's cognitive ability adequate to safely complete daily activities?: Yes Patient able to express need for assistance with ADLs?: Yes Independently performs ADLs?: Yes (appropriate for developmental age)       Abuse/Neglect Assessment (Assessment to be complete while patient is alone) Abuse/Neglect Assessment Can Be Completed: Yes Physical Abuse: Denies Verbal Abuse: Denies Sexual Abuse: Denies Exploitation of patient/patient's resources: Denies Self-Neglect: Denies   Consults Spiritual Care Consult Needed: No Transition of Care Team Consult Needed: No Advance Directives (For Healthcare) Does Patient Have a Medical Advance Directive?: No          Disposition:  Disposition Initial Assessment Completed for this Encounter: Yes  On Site Evaluation by:   Reviewed with Physician:    Asa Saunas 08/26/2019 1:41 AM

## 2020-07-16 ENCOUNTER — Emergency Department: Payer: Self-pay

## 2020-07-16 ENCOUNTER — Emergency Department
Admission: EM | Admit: 2020-07-16 | Discharge: 2020-07-21 | Disposition: A | Discharge status: Home / Self Care | Payer: Self-pay | Attending: Emergency Medicine | Admitting: Emergency Medicine

## 2020-07-16 ENCOUNTER — Encounter: Payer: Self-pay | Admitting: Emergency Medicine

## 2020-07-16 ENCOUNTER — Other Ambulatory Visit: Payer: Self-pay

## 2020-07-16 DIAGNOSIS — F1910 Other psychoactive substance abuse, uncomplicated: Secondary | ICD-10-CM | POA: Insufficient documentation

## 2020-07-16 DIAGNOSIS — F323 Major depressive disorder, single episode, severe with psychotic features: Secondary | ICD-10-CM | POA: Diagnosis present

## 2020-07-16 DIAGNOSIS — F1410 Cocaine abuse, uncomplicated: Secondary | ICD-10-CM

## 2020-07-16 DIAGNOSIS — Z20822 Contact with and (suspected) exposure to covid-19: Secondary | ICD-10-CM | POA: Insufficient documentation

## 2020-07-16 DIAGNOSIS — F330 Major depressive disorder, recurrent, mild: Secondary | ICD-10-CM | POA: Insufficient documentation

## 2020-07-16 DIAGNOSIS — F4310 Post-traumatic stress disorder, unspecified: Secondary | ICD-10-CM | POA: Insufficient documentation

## 2020-07-16 DIAGNOSIS — F1520 Other stimulant dependence, uncomplicated: Secondary | ICD-10-CM | POA: Insufficient documentation

## 2020-07-16 DIAGNOSIS — F1210 Cannabis abuse, uncomplicated: Secondary | ICD-10-CM | POA: Insufficient documentation

## 2020-07-16 DIAGNOSIS — J020 Streptococcal pharyngitis: Secondary | ICD-10-CM

## 2020-07-16 DIAGNOSIS — R0602 Shortness of breath: Secondary | ICD-10-CM | POA: Insufficient documentation

## 2020-07-16 DIAGNOSIS — F17290 Nicotine dependence, other tobacco product, uncomplicated: Secondary | ICD-10-CM | POA: Insufficient documentation

## 2020-07-16 DIAGNOSIS — R072 Precordial pain: Secondary | ICD-10-CM | POA: Insufficient documentation

## 2020-07-16 LAB — TROPONIN I (HIGH SENSITIVITY)
Troponin I (High Sensitivity): 14 ng/L (ref <18)
Troponin I (High Sensitivity): 7 ng/L (ref <18)

## 2020-07-16 LAB — CBC
HCT: 44.1 % (ref 39.0–52.0)
Hemoglobin: 14.8 g/dL (ref 13.0–17.0)
MCH: 27.4 pg (ref 26.0–34.0)
MCHC: 33.6 g/dL (ref 30.0–36.0)
MCV: 81.7 fL (ref 80.0–100.0)
Platelets: 220 10*3/uL (ref 150–400)
RBC: 5.4 MIL/uL (ref 4.22–5.81)
RDW: 12.6 % (ref 11.5–15.5)
WBC: 6.1 10*3/uL (ref 4.0–10.5)
nRBC: 0 % (ref 0.0–0.2)

## 2020-07-16 LAB — BASIC METABOLIC PANEL
Anion gap: 8 (ref 5–15)
BUN: 12 mg/dL (ref 6–20)
CO2: 24 mmol/L (ref 22–32)
Calcium: 9 mg/dL (ref 8.9–10.3)
Chloride: 104 mmol/L (ref 98–111)
Creatinine, Ser: 1.22 mg/dL (ref 0.61–1.24)
GFR, Estimated: >60 mL/min (ref >60)
Glucose, Bld: 117 mg/dL — ABNORMAL HIGH (ref 70–99)
Potassium: 3.6 mmol/L (ref 3.5–5.1)
Sodium: 136 mmol/L (ref 135–145)

## 2020-07-16 LAB — SARS CORONAVIRUS 2 BY RT PCR (HOSPITAL ORDER, PERFORMED IN ~~LOC~~ HOSPITAL LAB): SARS Coronavirus 2: NEGATIVE

## 2020-07-16 NOTE — ED Notes (Addendum)
Pt wandering away from his bed, speaking with other patients.  Pt asked if he could go outside for fresh air.  EDP and RN told pt this was not possible. Pt voiced understanding.

## 2020-07-16 NOTE — ED Notes (Signed)
SOC called for consult

## 2020-07-16 NOTE — ED Notes (Signed)
Patient given sandwich tray for snack.  Patient placed tray to the side and stated he would eat it later.

## 2020-07-16 NOTE — ED Notes (Signed)
First Nurse Note: Pt to ED via [facility] from home for chest pain x 4 hours. 7/10 left chest wall, tenderness with palpation. NSR, 12 lead WNL. Pt in NAD.

## 2020-07-16 NOTE — ED Notes (Signed)
Pt speaking with TTS 

## 2020-07-16 NOTE — ED Notes (Signed)
Pt in lobby at this time, reporting to this RN and security that he wants to harm himself. Pt moved closer to this RN while we are waiting on room.

## 2020-07-16 NOTE — ED Triage Notes (Signed)
Pt to ED via [facility] from home for reported chest pain. Pt reports that he thinks he is having a "light stroke". When asked what kind of stroke symptoms he is having pain reportts dull chest pain. Pt states that he has been "drinking, smoking, and popping pills". Pt states that he is not sure what kind of pills he has been taking. Pt states that he has been smoking crack, marijuana, and meth. Pt reports that he is having shortness of breath as well. Pt is in NAD at this time and is able to speak in complete sentences.

## 2020-07-16 NOTE — ED Provider Notes (Signed)
[facility] Emergency Department Provider Note   ____________________________________________   Event Date/Time   First MD Initiated Contact with Patient 07/16/20 [phone]     (approximate)  I have reviewed the triage vital signs and the nursing notes.   HISTORY  Chief Complaint Chest Pain    HPI Kenneth Yang is a 55 y.o. male with a stated past medical history of major depression with psychotic features, schizoaffective disorder, and substance induced psychotic disorder with hallucinations who presents for persistent polysubstance abuse.  Patient states that he has been drinking, smoking, and popping pills admitting to crack, marijuana, and meth use it has been heavier over the past 7 days.  Patient states that he has developed dull aching substernal, nonradiating chest pain that is 7/10, tender to palpation, and has been stable for the last 4 hours.  Patient denies any exertional component to this pain.  Patient denies associated neck or arm pain.  Patient does endorse mild shortness of breath on exertion.  Patient is more concerned about possible's substance rehab and requests to see "a rehab specialist" and psychiatrist.  Patient currently denies any suicidal ideation, homicidal ideation, auditory/visual hallucinations, vision changes, tinnitus, difficulty speaking, facial droop, sore throat, chest pain, shortness of breath, abdominal pain, nausea/vomiting/diarrhea, dysuria, or weakness/numbness/paresthesias in any extremity         History reviewed. No pertinent past medical history.  Patient Active Problem List   Diagnosis Date Noted  . Major depression with psychotic features (HCC) 08/26/2019  . Schizoaffective disorder, depressive type (HCC) 08/26/2019  . 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 Auditory hallucinations 08/26/2019    History reviewed. No pertinent surgical history.  Prior to Admission  medications   Not on File    Allergies Risperidone  No family history on file.  Social History Social History   Tobacco Use  . Smoking status: Current Every Day Smoker    Types: E-cigarettes  . Smokeless tobacco: Never Used  Substance Use Topics  . Alcohol use: Yes  . Drug use: Yes    Types: Methamphetamines, Marijuana    Review of Systems Constitutional: No fever/chills Eyes: No visual changes. ENT: No sore throat. Cardiovascular: Endorses chest pain. Respiratory: Denies shortness of breath. Gastrointestinal: No abdominal pain.  No nausea, no vomiting.  No diarrhea. Genitourinary: Negative for dysuria. Musculoskeletal: Denies any acute arthralgias Skin: Negative for rash. Neurological: Negative for headaches, weakness/numbness/paresthesias in any extremity Psychiatric: Negative for suicidal ideation/homicidal ideation   ____________________________________________   PHYSICAL EXAM:  VITAL SIGNS: ED Triage Vitals  Enc Vitals Group     BP 07/16/20 0541 137/79     Pulse Rate 07/16/20 0541 75     Resp 07/16/20 0541 16     Temp 07/16/20 0541 97.7 F (36.5 C)     Temp Source 07/16/20 0541 Oral     SpO2 07/16/20 0541 98 %     Weight 07/16/20 0537 162 lb (73.5 kg)     Height 07/16/20 0537 5\' 8"  (1.727 m)     Head Circumference --      Peak Flow --      Pain Score 07/16/20 0537 8     Pain Loc --      Pain Edu? --      Excl. in GC? --    Constitutional: Alert and oriented. Well appearing and in no acute distress. Eyes: Conjunctivae are normal. PERRL. Head: Atraumatic. Nose: No congestion/rhinnorhea. Mouth/Throat: Mucous membranes are moist. Neck: No stridor Cardiovascular:  Grossly normal heart sounds.  Good peripheral circulation. Respiratory: Normal respiratory effort.  No retractions. Gastrointestinal: Soft and nontender. No distention. Musculoskeletal: Moves all extremities spontaneously Neurologic:  Normal speech and language. No gross focal neurologic  deficits are appreciated. Skin:  Skin is warm and dry. No rash noted. Psychiatric: Mood and affect are normal. Speech and behavior are normal.  ____________________________________________   LABS (all labs ordered are listed, but only abnormal results are displayed)  Labs Reviewed  BASIC METABOLIC PANEL - Abnormal; Notable for the following components:      Result Value   Glucose, Bld 117 (*)    All other components within normal limits  CBC  TROPONIN I (HIGH SENSITIVITY)  TROPONIN I (HIGH SENSITIVITY)   ____________________________________________  EKG  ED ECG REPORT I, Merwyn Katos, the attending physician, personally viewed and interpreted this ECG.  Date: 07/16/2020 EKG Time: 0531 Rate: 76 Rhythm: normal sinus rhythm QRS Axis: normal Intervals: normal ST/T Wave abnormalities: normal Narrative Interpretation: no evidence of acute ischemia  ____________________________________________  RADIOLOGY  ED MD interpretation: 2 view x-ray of the chest shows no evidence of acute abnormalities including no pneumonia, pneumothorax, or widened mediastinum  Official radiology report(s): DG Chest 2 View  Result Date: 07/16/2020 CLINICAL DATA:  Chest pain and shortness of breath. Patient admits to drinking and using drugs. EXAM: CHEST - 2 VIEW COMPARISON:  None. FINDINGS: The heart size and mediastinal contours are within normal limits. Both lungs are clear. The visualized skeletal structures are unremarkable. IMPRESSION: Negative two view chest x-ray. Electronically Signed   By: Marin Roberts M.D.   On: 07/16/2020 06:03    ____________________________________________   PROCEDURES  Procedure(s) performed (including Critical Care):  Procedures   ____________________________________________   INITIAL IMPRESSION / ASSESSMENT AND PLAN / ED COURSE  As part of my medical decision making, I reviewed the following data within the electronic MEDICAL RECORD NUMBER Nursing  notes reviewed and incorporated, Labs reviewed, EKG interpreted, Old chart reviewed, Radiograph reviewed and Notes from prior ED visits reviewed and incorporated        Workup: ECG, CXR, CBC, BMP, Troponin Findings: ECG: No overt evidence of STEMI. No evidence of Brugadas sign, delta wave, epsilon wave, significantly prolonged QTc, or malignant arrhythmia HS Troponin: Negative x1 Other Labs unremarkable for emergent problems. CXR: Without PTX, PNA, or widened mediastinum Last Stress Test: Denies Last Heart Catheterization: Denies HEART Score: 3  Given History, Exam, and Workup I have low suspicion for ACS, Pneumothorax, Pneumonia, Pulmonary Embolus, Tamponade, Aortic Dissection or other emergent problem as a cause for this presentation.   Reassesment: Patient requesting resources for substance rehabilitation as well as psychiatry due to worsening depressive symptoms.  Patient still voices no suicidal ideation, homicidal ideation, auditory/visual hallucinations  Disposition:  Discharge. Strict return precautions discussed with patient with full understanding. Advised patient to follow up promptly with primary care provider       ____________________________________________   FINAL CLINICAL IMPRESSION(S) / ED DIAGNOSES  Final diagnoses:  Precordial pain  Polysubstance abuse (HCC)  Mild episode of recurrent major depressive disorder [facility])     ED Discharge Orders    None       Note:  This document was prepared using Dragon voice recognition software and may include unintentional dictation errors.   Merwyn Katos, MD 07/16/20 [phone]
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
## 2020-07-16 NOTE — ED Notes (Signed)
Meal tray given 

## 2020-07-16 NOTE — ED Notes (Signed)
Pt stating he has cramps in his hands. EDP made aware.

## 2020-07-16 NOTE — ED Notes (Signed)
Pt belongings:   Black hoody Black pants Black tennis shoes White socks Wallet (No cash, just cards) Quarter Vap Chemical engineer sweat pants Tank top  [employer]

## 2020-07-16 NOTE — ED Notes (Signed)
SOC in progress.  

## 2020-07-17 LAB — POC SARS CORONAVIRUS 2 AG -  ED: SARS Coronavirus 2 Ag: NEGATIVE

## 2020-07-17 LAB — SARS CORONAVIRUS 2 BY RT PCR (HOSPITAL ORDER, PERFORMED IN ~~LOC~~ HOSPITAL LAB): SARS Coronavirus 2: NEGATIVE

## 2020-07-17 LAB — GROUP A STREP BY PCR: Group A Strep by PCR: DETECTED — AB

## 2020-07-17 MED ORDER — AMOXICILLIN 500 MG PO CAPS
500.0000 mg | ORAL_CAPSULE | Freq: Two times a day (BID) | ORAL | Status: AC
  Administered 2020-07-17 – 2020-07-20 (×7): 500 mg via ORAL
  Filled 2020-07-17 (×17): qty 1

## 2020-07-17 MED ORDER — IBUPROFEN 800 MG PO TABS
400.0000 mg | ORAL_TABLET | Freq: Once | ORAL | Status: AC
  Administered 2020-07-17: 400 mg via ORAL
  Filled 2020-07-17: qty 1

## 2020-07-17 NOTE — ED Notes (Signed)
Patient sleeping, vitals signs deferred.

## 2020-07-17 NOTE — ED Provider Notes (Addendum)
Patient complaining of mild sore throat and headache, found to have elevated temperature.  Rapid swab negative for Covid, will send PCR as well as strep test.  No reports of cough or shortness of breath   Jene Every, MD 07/17/20 2133    ----------------------------------------- 11:02 PM on 07/17/2020 -----------------------------------------  Patient strep positive, will start amoxicillin   Jene Every, MD 07/17/20 2302

## 2020-07-17 NOTE — ED Notes (Signed)
Pt given extra blanket

## 2020-07-17 NOTE — ED Notes (Signed)
Pt requesting his Risperdal.  Pharmacy tech and RN could not find any record of  home medications.  Pt did not know the pharmacy where the medication is prescribed.

## 2020-07-17 NOTE — ED Notes (Signed)
Meal tray given 

## 2020-07-17 NOTE — ED Notes (Signed)
Meal tray given 

## 2020-07-17 NOTE — BH Assessment (Signed)
Referral information for inpatient Placement have been faxed to:   [facility] ([phone])    [facility] ([phone]--[phone])   [facility] ([phone] or [phone])    [facility] ([phone]),    [facility] ([phone]),    [facility] ([phone]---[phone]---[phone]),   [facility] ([phone], [phone], [phone] or [phone]),    [facility] ([phone]),    [facility] ([phone]).   [facility] ([phone])  

## 2020-07-17 NOTE — ED Notes (Signed)
Pt sleeping. VS will be taken once awake. 

## 2020-07-17 NOTE — ED Notes (Signed)
Pt given a cup of ice water with no straw or lid.

## 2020-07-17 NOTE — ED Notes (Signed)
Pt awake, alert, ambulatory. Vitals obtained and to be taken to [facility].

## 2020-07-17 NOTE — ED Provider Notes (Signed)
Emergency Medicine Observation Re-evaluation Note  Kenneth Yang is a 55 y.o. male, seen on rounds today.  Pt initially presented to the ED for complaints of Chest Pain Currently, the patient is resting, voices no medical complaint.  Physical Exam  BP (!) 95/52   Pulse 75   Temp 98.4 F (36.9 C) (Oral)   Resp 18   Ht 5\' 8"  (1.727 m)   Wt 73.5 kg   SpO2 96%   BMI 24.63 kg/m  Physical Exam General: Resting in no acute distress Cardiac: No cyanosis Lungs: Equal rise and fall Psych: Not agitated  ED Course / MDM  EKG:    I have reviewed the labs performed to date as well as medications administered while in observation.  Recent changes in the last 24 hours include no events overnight.  Plan  Current plan is for psychiatric disposition; referrals have been placed to multiple inpatient facility. Patient is not under full IVC at this time.   , MD 07/17/20 [phone]

## 2020-07-18 DIAGNOSIS — F323 Major depressive disorder, single episode, severe with psychotic features: Secondary | ICD-10-CM

## 2020-07-18 DIAGNOSIS — J020 Streptococcal pharyngitis: Secondary | ICD-10-CM

## 2020-07-18 DIAGNOSIS — F1410 Cocaine abuse, uncomplicated: Secondary | ICD-10-CM

## 2020-07-18 MED ORDER — ACETAMINOPHEN 325 MG PO TABS: 650.0000 mg | ORAL_TABLET | Freq: Three times a day (TID) | ORAL | Status: DC | PRN

## 2020-07-18 MED ORDER — ACETAMINOPHEN 500 MG PO TABS
1000.0000 mg | ORAL_TABLET | Freq: Four times a day (QID) | ORAL | Status: DC | PRN
  Administered 2020-07-18 – 2020-07-19 (×3): 1000 mg via ORAL
  Filled 2020-07-18 (×3): qty 2

## 2020-07-18 NOTE — ED Notes (Signed)
Report received from [place], RN including  Situation, Background, Assessment, and Recommendations. Patient alert and oriented, warm and dry, in no acute distress. Patient denies SI, HI, AVH and pain. Patient made aware of Q15 minute rounds and security cameras for their safety. Patient instructed to come to this nurse with needs or concerns.

## 2020-07-18 NOTE — ED Notes (Signed)
Hourly rounding completed at this time, patient currently awake in room. No complaints, stable, and in no acute distress. Q15 minute rounds and monitoring via Security Cameras to continue. 

## 2020-07-18 NOTE — ED Notes (Signed)
Nurse talked with Patient , He is alert and oriented, complaining of chills, and then being hot, He has been afebrile, taking antibiotics for strep throat, Patient states that HE has been depressed, but denies Si/hi or avh at this time, Nurse will continue to monitor, camera surveillance in progress for safety also.

## 2020-07-18 NOTE — ED Notes (Signed)
Hourly rounding reveals patient in room. No complaints, stable, in no acute distress. Q15 minute rounds and monitoring via Security Cameras to continue. 

## 2020-07-18 NOTE — ED Notes (Signed)
VS not taken. Patient asleep 

## 2020-07-18 NOTE — ED Notes (Signed)
Dr. Vicente Males notified of pt temperature, states no intervention needed.

## 2020-07-18 NOTE — ED Notes (Signed)
Patient up to the bathroom, no signs of distress. 

## 2020-07-18 NOTE — ED Notes (Signed)
Hourly rounding completed at this time, patient currently asleep in room. No complaints, stable, and in no acute distress. Q15 minute rounds and monitoring via Security Cameras to continue. 

## 2020-07-18 NOTE — ED Notes (Signed)
Patient came out of His room, states that He feels better, nurse ask him to stay in room as much as possible as strep throat is contagious, He was polite to nurse and said that He would, He told nurse that He hopes to be admitted for treatment because he feels as He needs it, NUrse let him know that a bed search was in place at different hospitals, will continue to monitor.

## 2020-07-18 NOTE — ED Notes (Signed)
Pt requests this nurse place a note in his chart stating that pt wishes to go to rehab for his drug and alcohol addiction. Pt reports improvement of feelings and states he has been sweating.

## 2020-07-18 NOTE — ED Notes (Signed)
Pt provided with drink, denies need for snack. PT reminded of rules of BHU through night.

## 2020-07-18 NOTE — ED Notes (Signed)
VOL/  PENDING  PLACEMENT  PT SEEN  BY  DR  Toni Amend  MD

## 2020-07-18 NOTE — ED Notes (Signed)
Per charge nurse due to his Temperature 101.39F, patient to be moved to medical Room 26. This writer told the patient but patient refused. He said " I feel fine. I don't want to be poked again". Patient tried to explained to the patient that he need to be moved and Patient still refused. Charge nurse aware.

## 2020-07-18 NOTE — Consult Note (Signed)
[facility] Face-to-Face Psychiatry Consult   Reason for Consult: Consult for this 55 year old man with a history of depression and PTSD and drug abuse Referring Physician: Katrinka Blazing Patient Identification: Kenneth Yang MRN:  [id number] Principal Diagnosis: Major depression with psychotic features [facility]) Diagnosis:  Principal Problem:   Major depression with psychotic features (HCC) Active Problems:   Cocaine abuse (HCC)   Strep throat   Total Time spent with patient: 1 hour  Subjective:   Kenneth Yang is a 55 y.o. male patient admitted with "I need some help somewhere".  HPI: Patient seen chart reviewed.  Patient is withdrawn right now in part because of acute physical illness.  Came to the emergency room reporting depressed mood and anxiety hallucinations.  States that recently his PTSD had been really activated and he had been having visual hallucinations and flashbacks.  Suicidal thoughts without specific plan.  Feeling anxious and feeling unable to care for himself.  Patient states that he has been using drugs and alcohol.  When asked about what drugs he has been using he states a long list including narcotics and amphetamines but in fact his drug screen is only positive for cocaine.  Patient says he has a history of trauma in prison.  Currently living on the street not receiving any outpatient treatment.  He is also right now having fever and chills and sore throat and has a positive strep test  Past Psychiatric History: Past history of similar presentations drug abuse mood symptoms no known actual suicide attempts  Risk to Self:   Risk to Others:   Prior Inpatient Therapy:   Prior Outpatient Therapy:    Past Medical History: History reviewed. No pertinent past medical history. History reviewed. No pertinent surgical history. Family History: No family history on file. Family Psychiatric  History: See previous Social History:  Social History   Substance and Sexual Activity   Alcohol Use Yes     Social History   Substance and Sexual Activity  Drug Use Yes  . Types: Methamphetamines, Marijuana    Social History   Socioeconomic History  . Marital status: Single    Spouse name: Not on file  . Number of children: Not on file  . Years of education: Not on file  . Highest education level: Not on file  Occupational History  . Not on file  Tobacco Use  . Smoking status: Current Every Day Smoker    Types: E-cigarettes  . Smokeless tobacco: Never Used  Substance and Sexual Activity  . Alcohol use: Yes  . Drug use: Yes    Types: Methamphetamines, Marijuana  . Sexual activity: Not on file  Other Topics Concern  . Not on file  Social History Narrative  . Not on file   Social Determinants of Health   Financial Resource Strain: Not on file  Food Insecurity: Not on file  Transportation Needs: Not on file  Physical Activity: Not on file  Stress: Not on file  Social Connections: Not on file   Additional Social History:    Allergies:   Allergies  Allergen Reactions  . Risperidone Other (See Comments)    Tongue EPS, drooling    Labs:  Results for orders placed or performed during the hospital encounter of 07/16/20 (from the past 48 hour(s))  SARS Coronavirus 2 by RT PCR (hospital order, performed in [facility] hospital lab) Nasopharyngeal Nasopharyngeal Swab     Status: None   Collection Time: 07/16/20  7:22 PM   Specimen: Nasopharyngeal Swab  Result Value Ref Range   SARS Coronavirus 2 NEGATIVE NEGATIVE    Comment: (NOTE) SARS-CoV-2 target nucleic acids are NOT DETECTED.  The SARS-CoV-2 RNA is generally detectable in upper and lower respiratory specimens during the acute phase of infection. The lowest concentration of SARS-CoV-2 viral copies this assay can detect is 250 copies / mL. A negative result does not preclude SARS-CoV-2 infection and should not be used as the sole basis for treatment or other patient management decisions.  A  negative result may occur with improper specimen collection / handling, submission of specimen other than nasopharyngeal swab, presence of viral mutation(s) within the areas targeted by this assay, and inadequate number of viral copies (<250 copies / mL). A negative result must be combined with clinical observations, patient history, and epidemiological information.  Fact Sheet for Patients:   BoilerBrush.com.cy  Fact Sheet for Healthcare Providers: https://pope.com/  This test is not yet approved or  cleared by the [place] FDA and has been authorized for detection and/or diagnosis of SARS-CoV-2 by FDA under an Emergency Use Authorization (EUA).  This EUA will remain in effect (meaning this test can be used) for the duration of the COVID-19 declaration under Section 564(b)(1) of the Act, 21 U.S.C. section 360bbb-3(b)(1), unless the authorization is terminated or revoked sooner.  Performed at [facility], [address] [place], [place] [address]   POC SARS Coronavirus 2 Ag-ED - Nasal Swab     Status: None   Collection Time: 07/17/20  8:56 PM  Result Value Ref Range   SARS Coronavirus 2 Ag NEGATIVE NEGATIVE    Comment: (NOTE) SARS-CoV-2 antigen NOT DETECTED.   Negative results are presumptive.  Negative results do not preclude SARS-CoV-2 infection and should not be used as the sole basis for treatment or other patient management decisions, including infection  control decisions, particularly in the presence of clinical signs and  symptoms consistent with COVID-19, or in those who have been in contact with the virus.  Negative results must be combined with clinical observations, patient history, and epidemiological information. The expected result is Negative.  Fact Sheet for Patients: https://www.jennings-kim.com/  Fact Sheet for Healthcare  Providers: https://alexander-rogers.biz/  This test is not yet approved or cleared by the [place] FDA and  has been authorized for detection and/or diagnosis of SARS-CoV-2 by FDA under an Emergency Use Authorization (EUA).  This EUA will remain in effect (meaning this test can be used) for the duration of  the COV ID-19 declaration under Section 564(b)(1) of the Act, 21 U.S.C. section 360bbb-3(b)(1), unless the authorization is terminated or revoked sooner.    SARS Coronavirus 2 by RT PCR (hospital order, performed in [facility] hospital lab) Nasopharyngeal Nasopharyngeal Swab     Status: None   Collection Time: 07/17/20  9:56 PM   Specimen: Nasopharyngeal Swab  Result Value Ref Range   SARS Coronavirus 2 NEGATIVE NEGATIVE    Comment: (NOTE) SARS-CoV-2 target nucleic acids are NOT DETECTED.  The SARS-CoV-2 RNA is generally detectable in upper and lower respiratory specimens during the acute phase of infection. The lowest concentration of SARS-CoV-2 viral copies this assay can detect is 250 copies / mL. A negative result does not preclude SARS-CoV-2 infection and should not be used as the sole basis for treatment or other patient management decisions.  A negative result may occur with improper specimen collection / handling, submission of specimen other than nasopharyngeal swab, presence of viral mutation(s) within the areas targeted by this assay, and  inadequate number of viral copies (<250 copies / mL). A negative result must be combined with clinical observations, patient history, and epidemiological information.  Fact Sheet for Patients:   BoilerBrush.com.cy  Fact Sheet for Healthcare Providers: https://pope.com/  This test is not yet approved or  cleared by the [place] FDA and has been authorized for detection and/or diagnosis of SARS-CoV-2 by FDA under an Emergency Use Authorization (EUA).  This EUA  will remain in effect (meaning this test can be used) for the duration of the COVID-19 declaration under Section 564(b)(1) of the Act, 21 U.S.C. section 360bbb-3(b)(1), unless the authorization is terminated or revoked sooner.  Performed at [facility], [address] [place], [place] [address]   Group A Strep by PCR [facility] Only)     Status: Abnormal   Collection Time: 07/17/20  9:57 PM   Specimen: Throat; Sterile Swab  Result Value Ref Range   Group A Strep by PCR DETECTED (A) NOT DETECTED    Comment: Performed at [facility], [address], [place], [place] [address]    Current Facility-Administered Medications  Medication Dose Route Frequency Provider Last Rate Last Admin  . acetaminophen (TYLENOL) tablet 1,000 mg  1,000 mg Oral Q6H PRN Clapacs, John T, MD   1,000 mg at 07/18/20 1210  . amoxicillin (AMOXIL) capsule 500 mg  500 mg Oral Q12H Jene Every, MD   500 mg at 07/18/20 1018   No current outpatient medications on file.    Musculoskeletal: Strength & Muscle Tone: within normal limits Gait & Station: unsteady Patient leans: N/A  Psychiatric Specialty Exam: Physical Exam Vitals and nursing note reviewed.  Constitutional:      Appearance: He is well-developed and well-nourished.  HENT:     Head: Normocephalic and atraumatic.  Eyes:     Conjunctiva/sclera: Conjunctivae normal.     Pupils: Pupils are equal, round, and reactive to light.  Cardiovascular:     Heart sounds: Normal heart sounds.  Pulmonary:     Effort: Pulmonary effort is normal.  Abdominal:     Palpations: Abdomen is soft.  Musculoskeletal:        General: Normal range of motion.     Cervical back: Normal range of motion.  Skin:    General: Skin is warm and dry.  Neurological:     Mental Status: He is alert.  Psychiatric:        Attention and Perception: He is inattentive.        Mood and Affect: Mood is anxious and depressed.        Speech: Speech is delayed.         Behavior: Behavior is slowed.        Thought Content: Thought content includes suicidal ideation. Thought content does not include homicidal ideation. Thought content does not include suicidal plan.        Cognition and Memory: Cognition is impaired.     Review of Systems  Constitutional: Positive for chills.  HENT: Positive for sore throat.   Eyes: Negative.   Respiratory: Negative.   Cardiovascular: Negative.   Gastrointestinal: Negative.   Musculoskeletal: Negative.   Skin: Negative.   Neurological: Negative.   Psychiatric/Behavioral: Positive for dysphoric mood, hallucinations and suicidal ideas.    Blood pressure 119/81, pulse 91, temperature (!) 102.7 F (39.3 C), temperature source Oral, resp. rate 18, height 5\' 8"  (1.727 m), weight 73.5 kg, SpO2 98 %.Body mass index is 24.63 kg/m.  General Appearance: Disheveled  Eye Contact:  Minimal  Speech:  Slow  Volume:  Decreased  Mood:  Depressed  Affect:  Constricted  Thought Process:  Coherent  Orientation:  Full (Time, Place, and Person)  Thought Content:  Paranoid Ideation and Rumination  Suicidal Thoughts:  Yes.  without intent/plan  Homicidal Thoughts:  No  Memory:  Immediate;   Fair Recent;   Fair Remote;   Fair  Judgement:  Fair  Insight:  Fair  Psychomotor Activity:  Decreased  Concentration:  Concentration: Poor  Recall:  [employer] of Knowledge:  Fair  Language:  Fair  Akathisia:  No  Handed:  Right  AIMS (if indicated):     Assets:  Desire for Improvement  ADL's:  Impaired  Cognition:  Impaired,  Mild  Sleep:        Treatment Plan Summary: Medication management and Plan Patient with drug abuse and PTSD with suicidal ideation.  Currently with fever and chills and a elevated temperature and sore throat with a positive strep test.  This is being treated with amoxicillin.  Monitor for behavior here on the emergency room holding area.  When medically well enough for inpatient treatment reassess for possible  admission.  Disposition: Recommend psychiatric Inpatient admission when medically cleared. Supportive therapy provided about ongoing stressors.  Mordecai Rasmussen, MD 07/18/2020 1:18 PM

## 2020-07-18 NOTE — ED Notes (Signed)
Patient c/o generalized ache and pain and sore throat. EDP notified

## 2020-07-18 NOTE — ED Provider Notes (Signed)
Emergency Medicine Observation Re-evaluation Note  Kenneth Yang is a 55 y.o. male, seen on rounds today.  Pt initially presented to the ED for complaints of Chest Pain Currently, the patient is sleeping comfortably.  Physical Exam  BP 118/73 (BP Location: Right Arm)   Pulse 75   Temp 98.6 F (37 C) (Oral)   Resp 18   Ht 5\' 8"  (1.727 m)   Wt 73.5 kg   SpO2 97%   BMI 24.63 kg/m  Physical Exam Constitutional:      Appearance: He is not ill-appearing or toxic-appearing.  HENT:     Head: Atraumatic.  Cardiovascular:     Comments: Well perfused Pulmonary:     Effort: Pulmonary effort is normal.  Abdominal:     General: There is no distension.  Musculoskeletal:        General: No deformity.  Skin:    Findings: No rash.  Neurological:     General: No focal deficit present.     Cranial Nerves: No cranial nerve deficit.      ED Course / MDM  EKG:    I have reviewed the labs performed to date as well as medications administered while in observation.  Recent changes in the last 24 hours include strep positive, on amoxicillin course.  Plan  Current plan is for inpatient psychiatric care. Patient is not under full IVC at this time.   , MD 07/18/20 [phone]

## 2020-07-18 NOTE — ED Notes (Signed)
Pt seen in room, urinating in cup that is on the floor. This nurse goes to pt room and informs pt that he needs to use restroom in [place]. PT states that he jsut goes in room because sometimes he can't go in restroom. Pt states he goes and dumps urine in toilet after going to restroom. Pt room is still clean but pt is asked if he will not urinate in room. Pt reminded of 24/7 cameras in room for safety.

## 2020-07-18 NOTE — ED Notes (Signed)
Report to include Situation, Background, Assessment, and Recommendations received from Amy RN. Patient alert and oriented, warm and dry, in no acute distress. Patient denies SI, HI, AVH and pain. Patient made aware of Q15 minute rounds and security cameras for their safety. Patient instructed to come to me with needs or concerns.  

## 2020-07-19 LAB — URINE DRUG SCREEN, QUALITATIVE (ARMC ONLY)
Amphetamines, Ur Screen: NOT DETECTED
Barbiturates, Ur Screen: NOT DETECTED
Benzodiazepine, Ur Scrn: NOT DETECTED
Cannabinoid 50 Ng, Ur ~~LOC~~: POSITIVE — AB
Cocaine Metabolite,Ur ~~LOC~~: POSITIVE — AB
MDMA (Ecstasy)Ur Screen: NOT DETECTED
Methadone Scn, Ur: NOT DETECTED
Opiate, Ur Screen: NOT DETECTED
Phencyclidine (PCP) Ur S: NOT DETECTED
Tricyclic, Ur Screen: NOT DETECTED

## 2020-07-19 MED ORDER — AMOXICILLIN 500 MG PO CAPS: 500.0000 mg | ORAL_CAPSULE | Freq: Two times a day (BID) | ORAL | 0 refills | Status: DC

## 2020-07-19 NOTE — ED Notes (Signed)
Hourly rounding completed at this time, patient currently asleep in room. No complaints, stable, and in no acute distress. Q15 minute rounds and monitoring via Security Cameras to continue. 

## 2020-07-19 NOTE — ED Notes (Signed)
Hourly rounding completed at this time, patient currently awake in room. No complaints, stable, and in no acute distress. Q15 minute rounds and monitoring via Security Cameras to continue. 

## 2020-07-19 NOTE — ED Notes (Signed)
Pt refused shower but wants to change clothes. Given new underwear, clothes, deodorant, and toothbrush/toothpaste.

## 2020-07-19 NOTE — ED Notes (Signed)
Pt provided with [place] sandwich tray, crackers and peanut butter with water to drink. Pt educated on rules of of BHU at night

## 2020-07-19 NOTE — ED Notes (Signed)
Pt given diet sprite. Pt calm and cooperative. States his throat is feeling a little better. Denies further needs.

## 2020-07-19 NOTE — BH Assessment (Addendum)
Per pt's request, TTS made contact with [facility] regarding their bed availability. TTS spoke to [place] who agreed to review the pt's information and make contact with [facility] accordingly. Eber Jones also expressed willingness to work directly with pt if pt is discharged as long as she has a working Electronics engineer.   TTS faxed pt's referral at 4pm.   Eber Jones contacted [facility] at 4:25pm requesting a UDS. TTS updated pt's current RN Maralyn Sago) who is working on completing the order. TTS will follow up accordingly.   TTS faxed UDS at 6pm. Susie contacted [facility] reporting a tentative plan to accept pt for tomorrow pending a no show or discharge. Susie agreed to make contact with [facility] tomorrow to finalize a plan.

## 2020-07-19 NOTE — ED Notes (Signed)
Pt asleep at this time, unable to collect vitals. Will collect pt vitals once awake. 

## 2020-07-19 NOTE — ED Notes (Signed)
Pt given water 

## 2020-07-19 NOTE — ED Provider Notes (Addendum)
Emergency Medicine Observation Re-evaluation Note  Marshal Eskew is a 55 y.o. male, seen on rounds today.  Pt initially presented to the ED for complaints of Chest Pain Currently, the patient is sleeping, denies complaints when woken.  Physical Exam  BP 115/73 (BP Location: Right Arm)   Pulse 87   Temp (!) 100.6 F (38.1 C) (Oral)   Resp 18   Ht 5\' 8"  (1.727 m)   Wt 73.5 kg   SpO2 97%   BMI 24.63 kg/m  Physical Exam Constitutional: Resting comfortably. Eyes: Conjunctivae are normal. Head: Atraumatic. Nose: No congestion/rhinnorhea. Mouth/Throat: Mucous membranes are moist. Neck: Normal ROM Cardiovascular: No cyanosis noted. Respiratory: Normal respiratory effort. Gastrointestinal: Non-distended. Genitourinary: deferred Musculoskeletal: No lower extremity tenderness nor edema. Neurologic:  Normal speech and language. No gross focal neurologic deficits are appreciated. Skin:  Skin is warm, dry and intact. No rash noted.    ED Course / MDM  EKG:EKG Interpretation  Date/Time:  Saturday July 16 2020 05:31:04 EST Ventricular Rate:  76 PR Interval:  148 QRS Duration: 98 QT Interval:  374 QTC Calculation: 420 R Axis:   63 Text Interpretation: Normal sinus rhythm Minimal voltage criteria for LVH, may be normal variant ( Sokolow-Lyon ) Borderline ECG Confirmed by UNCONFIRMED, DOCTOR (03-13-1973), editor [id number], Tammy [phone]) on 07/18/2020 3:43:06 PM    I have reviewed the labs performed to date as well as medications administered while in observation.  Recent changes in the last 24 hours include none.  ----------------------------------------- 3:32 PM on 07/19/2020 -----------------------------------------  Patient now stating that he would like to be discharged home.  He was reevaluated by Dr. 09/16/2020 of psychiatry, who feels patient is appropriate for discharge home with resources for substance abuse.  There is no indication for IVC as he does not represent a threat to  harm self or others.  Plan  Current plan is for discharge home with substance abuse resources. Patient is not under full IVC at this time.   Toni Amend, MD 07/19/20 09/16/20    [id number], MD 07/19/20 [phone]

## 2020-07-19 NOTE — ED Notes (Signed)
Report received from Jessica, RN including  Situation, Background, Assessment, and Recommendations. Patient alert and oriented, warm and dry, in no acute distress. Patient denies SI, HI, AVH and pain. Patient made aware of Q15 minute rounds and security cameras for their safety. Patient instructed to come to this nurse with needs or concerns. 

## 2020-07-19 NOTE — ED Notes (Signed)
Dr clapacs at bedside 

## 2020-07-19 NOTE — ED Notes (Signed)
Pt requesting to leave or speak to Dr Toni Amend. Dr. Toni Amend notified.

## 2020-07-19 NOTE — Consult Note (Signed)
[facility] Face-to-Face Psychiatry Consult   Reason for Consult: Follow-up consult note for 55 year old man presented voluntarily seeking mental health treatment Referring Physician: [place] Patient Identification: Kenneth Yang MRN:  [id number] Principal Diagnosis: Cocaine abuse (HCC) Diagnosis:  Principal Problem:   Cocaine abuse (HCC) Active Problems:   Major depression with psychotic features (HCC)   Strep throat   Total Time spent with patient: 30 minutes  Subjective:   Kenneth Yang is a 55 y.o. male patient admitted with "I just need to get into rehab".  HPI: Patient reevaluated today.  On evaluation yesterday he had requested inpatient and had endorsed symptoms primarily of anxiety and depression with suicidal ideation.  Today he says he barely remembers talking to me yesterday and that he feels much better.  He says his mood recently has been a little up and down but not severely depressed.  He denies any suicidal thought at all.  Denies any psychotic symptoms.  He says his only desire is to get into a substance abuse rehab treatment program.  He is familiar with several of them and says he has been through rehab programs many times in the past.  He is currently calm and cooperative and lucid.  Past Psychiatric History: Past history of longstanding intermittent issues with substance abuse and mood symptoms at times  Risk to Self:   Risk to Others:   Prior Inpatient Therapy:   Prior Outpatient Therapy:    Past Medical History: History reviewed. No pertinent past medical history. History reviewed. No pertinent surgical history. Family History: No family history on file. Family Psychiatric  History: See previous Social History:  Social History   Substance and Sexual Activity  Alcohol Use Yes     Social History   Substance and Sexual Activity  Drug Use Yes  . Types: Methamphetamines, Marijuana    Social History   Socioeconomic History  . Marital status: Single     Spouse name: Not on file  . Number of children: Not on file  . Years of education: Not on file  . Highest education level: Not on file  Occupational History  . Not on file  Tobacco Use  . Smoking status: Current Every Day Smoker    Types: E-cigarettes  . Smokeless tobacco: Never Used  Substance and Sexual Activity  . Alcohol use: Yes  . Drug use: Yes    Types: Methamphetamines, Marijuana  . Sexual activity: Not on file  Other Topics Concern  . Not on file  Social History Narrative  . Not on file   Social Determinants of Health   Financial Resource Strain: Not on file  Food Insecurity: Not on file  Transportation Needs: Not on file  Physical Activity: Not on file  Stress: Not on file  Social Connections: Not on file   Additional Social History:    Allergies:   Allergies  Allergen Reactions  . Risperidone Other (See Comments)    Tongue EPS, drooling    Labs:  Results for orders placed or performed during the hospital encounter of 07/16/20 (from the past 48 hour(s))  POC SARS Coronavirus 2 Ag-ED - Nasal Swab     Status: None   Collection Time: 07/17/20  8:56 PM  Result Value Ref Range   SARS Coronavirus 2 Ag NEGATIVE NEGATIVE    Comment: (NOTE) SARS-CoV-2 antigen NOT DETECTED.   Negative results are presumptive.  Negative results do not preclude SARS-CoV-2 infection and should not be used as the sole basis for treatment or  other patient management decisions, including infection  control decisions, particularly in the presence of clinical signs and  symptoms consistent with COVID-19, or in those who have been in contact with the virus.  Negative results must be combined with clinical observations, patient history, and epidemiological information. The expected result is Negative.  Fact Sheet for Patients: https://www.jennings-kim.com/  Fact Sheet for Healthcare Providers: https://alexander-rogers.biz/  This test is not yet approved or  cleared by the [place] FDA and  has been authorized for detection and/or diagnosis of SARS-CoV-2 by FDA under an Emergency Use Authorization (EUA).  This EUA will remain in effect (meaning this test can be used) for the duration of  the COV ID-19 declaration under Section 564(b)(1) of the Act, 21 U.S.C. section 360bbb-3(b)(1), unless the authorization is terminated or revoked sooner.    SARS Coronavirus 2 by RT PCR (hospital order, performed in [facility] hospital lab) Nasopharyngeal Nasopharyngeal Swab     Status: None   Collection Time: 07/17/20  9:56 PM   Specimen: Nasopharyngeal Swab  Result Value Ref Range   SARS Coronavirus 2 NEGATIVE NEGATIVE    Comment: (NOTE) SARS-CoV-2 target nucleic acids are NOT DETECTED.  The SARS-CoV-2 RNA is generally detectable in upper and lower respiratory specimens during the acute phase of infection. The lowest concentration of SARS-CoV-2 viral copies this assay can detect is 250 copies / mL. A negative result does not preclude SARS-CoV-2 infection and should not be used as the sole basis for treatment or other patient management decisions.  A negative result may occur with improper specimen collection / handling, submission of specimen other than nasopharyngeal swab, presence of viral mutation(s) within the areas targeted by this assay, and inadequate number of viral copies (<250 copies / mL). A negative result must be combined with clinical observations, patient history, and epidemiological information.  Fact Sheet for Patients:   BoilerBrush.com.cy  Fact Sheet for Healthcare Providers: https://pope.com/  This test is not yet approved or  cleared by the [place] FDA and has been authorized for detection and/or diagnosis of SARS-CoV-2 by FDA under an Emergency Use Authorization (EUA).  This EUA will remain in effect (meaning this test can be used) for the duration of the COVID-19  declaration under Section 564(b)(1) of the Act, 21 U.S.C. section 360bbb-3(b)(1), unless the authorization is terminated or revoked sooner.  Performed at [facility], [address] [place], [place] [address]   Group A Strep by PCR [facility] Only)     Status: Abnormal   Collection Time: 07/17/20  9:57 PM   Specimen: Throat; Sterile Swab  Result Value Ref Range   Group A Strep by PCR DETECTED (A) NOT DETECTED    Comment: Performed at [facility], [address] [place], [place] [address]    Current Facility-Administered Medications  Medication Dose Route Frequency Provider Last Rate Last Admin  . acetaminophen (TYLENOL) tablet 1,000 mg  1,000 mg Oral Q6H PRN Carlie Corpus, Jackquline Denmark, MD   1,000 mg at 07/19/20 0059  . amoxicillin (AMOXIL) capsule 500 mg  500 mg Oral Q12H Jene Every, MD   500 mg at 07/19/20 8250   No current outpatient medications on file.    Musculoskeletal: Strength & Muscle Tone: within normal limits Gait & Station: normal Patient leans: N/A  Psychiatric Specialty Exam: Physical Exam Vitals and nursing note reviewed.  Constitutional:      Appearance: He is well-developed and well-nourished.  HENT:     Head: Normocephalic and atraumatic.  Eyes:  Conjunctiva/sclera: Conjunctivae normal.     Pupils: Pupils are equal, round, and reactive to light.  Cardiovascular:     Heart sounds: Normal heart sounds.  Pulmonary:     Effort: Pulmonary effort is normal.  Abdominal:     Palpations: Abdomen is soft.  Musculoskeletal:        General: Normal range of motion.     Cervical back: Normal range of motion.  Skin:    General: Skin is warm and dry.  Neurological:     General: No focal deficit present.     Mental Status: He is alert.  Psychiatric:        Mood and Affect: Mood normal.     Review of Systems  Constitutional: Negative.   HENT: Negative.   Eyes: Negative.   Respiratory: Negative.   Cardiovascular: Negative.    Gastrointestinal: Negative.   Musculoskeletal: Negative.   Skin: Negative.   Neurological: Negative.   Psychiatric/Behavioral: Negative.     Blood pressure (!) 99/57, pulse 70, temperature 98.5 F (36.9 C), temperature source Oral, resp. rate 17, height 5\' 8"  (1.727 m), weight 73.5 kg, SpO2 97 %.Body mass index is 24.63 kg/m.  General Appearance: Casual  Eye Contact:  Good  Speech:  Clear and Coherent  Volume:  Normal  Mood:  Euthymic  Affect:  Congruent  Thought Process:  Coherent  Orientation:  Full (Time, Place, and Person)  Thought Content:  Logical  Suicidal Thoughts:  No  Homicidal Thoughts:  No  Memory:  Immediate;   Fair Recent;   Fair Remote;   Fair  Judgement:  Fair  Insight:  Fair  Psychomotor Activity:  Normal  Concentration:  Concentration: Fair  Recall:  of Knowledge:  Fair  Language:  Fair  Akathisia:  No  Handed:  Right  AIMS (if indicated):     Assets:  Desire for Improvement Physical Health Resilience  ADL's:  Intact  Cognition:  WNL  Sleep:        Treatment Plan Summary: Plan On reassessment patient is upbeat and lucid no longer endorsing depressive symptoms no sign of delirium no psychosis.  Patient denies suicidal ideation.  He no longer meets criteria for inpatient psychiatric hospitalization.  Explained to the patient that we would not be able to refer him from our emergency room to a longer term rehab program.  Suggested a couple of options including the [facility].  Patient was not interested in that and said that he could reach his brother about a living situation.  He will be given a prescription for the amoxicillin to finish the course for his strep throat and otherwise can be discharged from the emergency room to outpatient follow-up.  He is advised that if he stayed in [facility] he should follow-up with [facility].  Disposition: No evidence of imminent risk to self or others at present.   Patient does not meet criteria  for psychiatric inpatient admission. Supportive therapy provided about ongoing stressors. Discussed crisis plan, support from social network, calling 911, coming to the Emergency Department, and calling Suicide Hotline.  [facility], MD 07/19/2020 3:15 PM

## 2020-07-19 NOTE — ED Notes (Signed)
VOL/pending placement 

## 2020-07-20 NOTE — Consult Note (Signed)
[facility] Face-to-Face Psychiatry Consult   Reason for Consult: Follow-up today for patient with cocaine dependence who is still in the emergency room hoping for rehab referral Referring Physician: S[place]iqu[place] Patient Identification: Kenneth Yang MRN:  [id number] Principal Diagnosis: Cocaine abuse (HCC) Diagnosis:  Principal Problem:   Cocaine abuse (HCC) Active Problems:   Major depression with psychotic features (HCC)   Strep throat   Total Time spent with patient: 30 minutes  Subjective:   Kenneth Yang is a 55 y.o. male patient admitted with "I still want to go someplace for treatment".  HPI: Patient seen.  He notes that as mentioned above that he still wants to go to rehab treatment.  Continues to state he is very sincere about wanting to stop using cocaine and feels that rehab is been helpful for him in the past.  No current mood complaints.  Denies suicidal ideation.  No aggression or agitation or threatening behavior.  Denies suicidal thought.  No new physical complaints  Past Psychiatric History: Recurrent substance abuse treatment  Risk to Self:   Risk to Others:   Prior Inpatient Therapy:   Prior Outpatient Therapy:    Past Medical History: History reviewed. No pertinent past medical history. History reviewed. No pertinent surgical history. Family History: No family history on file. Family Psychiatric  History: See previous Social History:  Social History   Substance and Sexual Activity  Alcohol Use Yes     Social History   Substance and Sexual Activity  Drug Use Yes  . Types: Methamphetamines, Marijuana    Social History   Socioeconomic History  . Marital status: Single    Spouse name: Not on file  . Number of children: Not on file  . Years of education: Not on file  . Highest education level: Not on file  Occupational History  . Not on file  Tobacco Use  . Smoking status: Current Every Day Smoker    Types: E-cigarettes  . Smokeless tobacco: Never  Used  Substance and Sexual Activity  . Alcohol use: Yes  . Drug use: Yes    Types: Methamphetamines, Marijuana  . Sexual activity: Not on file  Other Topics Concern  . Not on file  Social History Narrative  . Not on file   Social Determinants of Health   Financial Resource Strain: Not on file  Food Insecurity: Not on file  Transportation Needs: Not on file  Physical Activity: Not on file  Stress: Not on file  Social Connections: Not on file   Additional Social History:    Allergies:   Allergies  Allergen Reactions  . Risperidone Other (See Comments)    Tongue EPS, drooling    Labs:  Results for orders placed or performed during the hospital encounter of 07/16/20 (from the past 48 hour(s))  Urine Drug Screen, Qualitative ([facility] only)     Status: Abnormal   Collection Time: 07/19/20  4:37 PM  Result Value Ref Range   Tricyclic, Ur Screen NONE DETECTED NONE DETECTED   Amphetamines, Ur Screen NONE DETECTED NONE DETECTED   MDMA (Ecstasy)Ur Screen NONE DETECTED NONE DETECTED   Cocaine Metabolite,Ur [place] POSITIVE (A) NONE DETECTED   Opiate, Ur Screen NONE DETECTED NONE DETECTED   Phencyclidine (PCP) Ur S NONE DETECTED NONE DETECTED   Cannabinoid 50 Ng, Ur [place] POSITIVE (A) NONE DETECTED   Barbiturates, Ur Screen NONE DETECTED NONE DETECTED   Benzodiazepine, Ur Scrn NONE DETECTED NONE DETECTED   Methadone Scn, Ur NONE DETECTED NONE DETECTED  Comment: (NOTE) Tricyclics + metabolites, urine    Cutoff 1000 ng/mL Amphetamines + metabolites, urine  Cutoff 1000 ng/mL MDMA (Ecstasy), urine              Cutoff 500 ng/mL Cocaine Metabolite, urine          Cutoff 300 ng/mL Opiate + metabolites, urine        Cutoff 300 ng/mL Phencyclidine (PCP), urine         Cutoff 25 ng/mL Cannabinoid, urine                 Cutoff 50 ng/mL Barbiturates + metabolites, urine  Cutoff 200 ng/mL Benzodiazepine, urine              Cutoff 200 ng/mL Methadone, urine                   Cutoff 300  ng/mL  The urine drug screen provides only a preliminary, unconfirmed analytical test result and should not be used for non-medical purposes. Clinical consideration and professional judgment should be applied to any positive drug screen result due to possible interfering substances. A more specific alternate chemical method must be used in order to obtain a confirmed analytical result. Gas chromatography / mass spectrometry (GC/MS) is the preferred confirm atory method. Performed at [facility], [address] [place], [place] [address]     Current Facility-Administered Medications  Medication Dose Route Frequency Provider Last Rate Last Admin  . acetaminophen (TYLENOL) tablet 1,000 mg  1,000 mg Oral Q6H PRN Naiara Lombardozzi, Jackquline Denmark, MD   1,000 mg at 07/19/20 0059  . amoxicillin (AMOXIL) capsule 500 mg  500 mg Oral Q12H Jene Every, MD   500 mg at 07/20/20 1021   Current Outpatient Medications  Medication Sig Dispense Refill  . amoxicillin (AMOXIL) 500 MG capsule Take 1 capsule (500 mg total) by mouth every 12 (twelve) hours. 8 capsule 0    Musculoskeletal: Strength & Muscle Tone: within normal limits Gait & Station: normal Patient leans: N/A  Psychiatric Specialty Exam: Physical Exam Vitals and nursing note reviewed.  Constitutional:      Appearance: He is well-developed and well-nourished.  HENT:     Head: Normocephalic and atraumatic.  Eyes:     Conjunctiva/sclera: Conjunctivae normal.     Pupils: Pupils are equal, round, and reactive to light.  Cardiovascular:     Heart sounds: Normal heart sounds.  Pulmonary:     Effort: Pulmonary effort is normal.  Abdominal:     Palpations: Abdomen is soft.  Musculoskeletal:        General: Normal range of motion.     Cervical back: Normal range of motion.  Skin:    General: Skin is warm and dry.  Neurological:     General: No focal deficit present.     Mental Status: He is alert.  Psychiatric:        Mood and  Affect: Mood normal.     Review of Systems  Constitutional: Negative.   HENT: Negative.   Eyes: Negative.   Respiratory: Negative.   Cardiovascular: Negative.   Gastrointestinal: Negative.   Musculoskeletal: Negative.   Skin: Negative.   Neurological: Negative.   Psychiatric/Behavioral: Negative.     Blood pressure 111/64, pulse (!) 59, temperature 99 F (37.2 C), temperature source Oral, resp. rate 17, height 5\' 8"  (1.727 m), weight 73.5 kg, SpO2 98 %.Body mass index is 24.63 kg/m.  General Appearance: Casual  Eye Contact:  Good  Speech:  Clear and Coherent  Volume:  Normal  Mood:  Euthymic  Affect:  Constricted  Thought Process:  Goal Directed  Orientation:  Full (Time, Place, and Person)  Thought Content:  Logical  Suicidal Thoughts:  No  Homicidal Thoughts:  No  Memory:  Immediate;   Fair Recent;   Fair Remote;   Fair  Judgement:  Fair  Insight:  Fair  Psychomotor Activity:  Normal  Concentration:  Concentration: Fair  Recall:  [employer] of Knowledge:  Fair  Language:  Fair  Akathisia:  No  Handed:  Right  AIMS (if indicated):     Assets:  Desire for Improvement  ADL's:  Intact  Cognition:  WNL  Sleep:        Treatment Plan Summary: Plan Patient has been accepted to residential treatment services for rehabilitation treatment.  Bed available at 1:00 PM today.  Patient agrees to plan.  We have made arrangements to have the pharmacy provide enough amoxicillin for him to finish course of treatment for strep throat.  No other medications required at this point.  Case reviewed with emergency room doctor and [facility].  Patient is not on IVC at this time and can be discharged with transportation to residential treatment services.  Disposition: Patient does not meet criteria for psychiatric inpatient admission. Supportive therapy provided about ongoing stressors.  Mordecai Rasmussen, MD 07/20/2020 11:39 AM

## 2020-07-20 NOTE — ED Notes (Signed)
Hourly rounding completed at this time, patient currently asleep in room. No complaints, stable, and in no acute distress. Q15 minute rounds and monitoring via Security Cameras to continue. 

## 2020-07-20 NOTE — ED Notes (Signed)
He has been accepted at [facility]  - [employer] was to pick him up at 1245 to arrive at [facility] at 1300  - the cab must have gotten delayed   - they will be calling me when they are enroute to the ED lobby

## 2020-07-20 NOTE — ED Notes (Signed)
Hourly rounding completed at this time, patient currently awake in room. No complaints, stable, and in no acute distress. Q15 minute rounds and monitoring via Security Cameras to continue. 

## 2020-07-20 NOTE — ED Notes (Signed)
Report received from Amy, RN including  Situation, Background, Assessment, and Recommendations. Patient alert and oriented, warm and dry, in no acute distress. Patient denies SI, HI, AVH and pain. Patient made aware of Q15 minute rounds and security cameras for their safety. Patient instructed to come to this nurse with needs or concerns. 

## 2020-07-20 NOTE — ED Notes (Signed)
Pt asleep at this time, unable to collect vitals. Will collect pt vitals once awake. 

## 2020-07-20 NOTE — ED Notes (Signed)
While in another room - I can hear him talking to himself    "things ain't gonna be good"  Repeating at times  Other words are inaudible

## 2020-07-20 NOTE — ED Notes (Signed)
He is awaiting detox placement  He states  "I need to stay here until they get me somewhere for me to go - I need to get a bed at [facility] or somewhere that help me   I f I leave then I will use again - I know it"   Assessment completed  He denies pain

## 2020-07-20 NOTE — ED Notes (Signed)
He has ambulated to and from the BR with a steady gait  NAD observed  - he laid back down

## 2020-07-20 NOTE — ED Notes (Signed)
Pt to discharge to [facility] for detox treatment   Dr Toni Amend has requested him to take 8 more doses of his Amoxicillin and the pharmacy is going to have him  8 500mg  Amoxicillin at discharge  I will educate him and instruct him on administration

## 2020-07-20 NOTE — BH Assessment (Signed)
Patient will be going to [facility] ([phone]) for 1pm. He will need transportation and his medications. Writer spoke with Psych MD (Dr. Toni Amend), he will work on making sure the patient get the medications. Spoke with Press photographer Erskine Squibb), patient will be transported by [employer]. Patient's nurse is aware of the plan.

## 2020-07-20 NOTE — ED Notes (Signed)
[facility] has taken away his bed due to the cab company was not able to be here and take him there before 1300   [facility] told [facility]TS that a bed will be available for him at 0930 in the am  I have informed [employer] and I will recall [employer] cab in the am

## 2020-07-20 NOTE — BH Assessment (Signed)
Received phone call from [facility] ([phone]), patient will have to come tomorrow (07/21/2020) at 9:30am because he missed his 1pm appointment. They have other admissions scheduled.  Writer updated patient's nurse (Amy T.,) and Psych MD (Dr. Toni Amend).

## 2020-07-20 NOTE — ED Provider Notes (Signed)
Emergency Medicine Observation Re-evaluation Note  Kenneth Yang is a 55 y.o. male, seen on rounds today.  Pt initially presented to the ED for complaints of Chest Pain Currently, the patient is resting comfortably in the room and has no acute complaints.  He is eager to go to [facility] today.  Physical Exam  BP 111/64 (BP Location: Left Arm)   Pulse (!) 59   Temp 99 F (37.2 C) (Oral)   Resp 17   Ht 5\' 8"  (1.727 m)   Wt 73.5 kg   SpO2 98%   BMI 24.63 kg/m  Physical Exam General: Alert and oriented.  Comfortable appearing. Cardiac: Good peripheral perfusion. Lungs: Normal respiratory effort. Psych: Calm and cooperative.  ED Course / MDM  I have reviewed the labs performed to date as well as medications administered while in observation.  There have been no recent changes in the patient's status in the last 24 hours.  He is awaiting discharge to [facility] this afternoon.  Plan  Current plan is for discharge to [facility] this afternoon. Patient is not under full IVC at this time.   , MD 07/20/20 1314

## 2020-07-20 NOTE — ED Notes (Signed)
VOL patient to [facility] in am

## 2020-07-21 NOTE — ED Notes (Signed)
esignature page not working at time of discharge on the mobile computer

## 2020-07-21 NOTE — ED Notes (Signed)
Pt asleep at this time, unable to collect vitals. Will collect pt vitals once awake. 

## 2020-07-21 NOTE — ED Notes (Signed)
Hourly rounding completed at this time, patient currently asleep in room. No complaints, stable, and in no acute distress. Q15 minute rounds and monitoring via Security Cameras to continue. 

## 2020-07-21 NOTE — ED Notes (Signed)
VOL/pending transport to [facility] at 9:30 AM.

## 2020-07-21 NOTE — ED Notes (Signed)
Hourly rounding completed at this time, patient currently awake in room. No complaints, stable, and in no acute distress. Q15 minute rounds and monitoring via Security Cameras to continue. 

## 2020-07-21 NOTE — ED Notes (Signed)
He is voluntary  - pending transfer to [facility]  - they will have a bed available for him at [employer] is enroute to pick him up for transport  Cab voucher provided    Assessment completed  He denies pain

## 2020-07-21 NOTE — ED Provider Notes (Signed)
Emergency Medicine Observation Re-evaluation Note  Kenneth Yang is a 55 y.o. male, seen on rounds today.  Pt initially presented to the ED for complaints of Chest Pain  Currently, the patient is is no acute distress. Denies any concerns at this time. Pt eager to get to [facility] given delayed yesterday.   Physical Exam  Blood pressure 111/64, pulse (!) 59, temperature 99 F (37.2 C), temperature source Oral, resp. rate 17, height 5\' 8"  (1.727 m), weight 73.5 kg, SpO2 98 %.  Physical Exam General: No apparent distress HEENT: moist mucous membranes CV: RRR Pulm: Normal WOB GI: soft and non tender MSK: no edema or cyanosis Neuro: face symmetric, moving all extremities     ED Course / MDM     I have reviewed the labs performed to date as well as medications administered while in observation.  Recent changes in the last 24 hours include none   Plan   Current plan is to continue to wait for psych plan/placement if felt warranted  Plan to go to [facility] today  Patient is not under full IVC at this time.   , MD 07/21/20 [phone]

## 2021-02-02 ENCOUNTER — Emergency Department: Payer: Self-pay

## 2021-02-02 ENCOUNTER — Emergency Department
Admission: EM | Admit: 2021-02-02 | Discharge: 2021-02-03 | Disposition: A | Discharge status: Other Acute Inpatient Hospital | Payer: Self-pay | Attending: Emergency Medicine | Admitting: Emergency Medicine

## 2021-02-02 DIAGNOSIS — Z20822 Contact with and (suspected) exposure to covid-19: Secondary | ICD-10-CM | POA: Insufficient documentation

## 2021-02-02 DIAGNOSIS — Y900 Blood alcohol level of less than 20 mg/100 ml: Secondary | ICD-10-CM | POA: Insufficient documentation

## 2021-02-02 DIAGNOSIS — F1410 Cocaine abuse, uncomplicated: Secondary | ICD-10-CM | POA: Diagnosis present

## 2021-02-02 DIAGNOSIS — F1910 Other psychoactive substance abuse, uncomplicated: Secondary | ICD-10-CM | POA: Insufficient documentation

## 2021-02-02 DIAGNOSIS — F17210 Nicotine dependence, cigarettes, uncomplicated: Secondary | ICD-10-CM | POA: Insufficient documentation

## 2021-02-02 DIAGNOSIS — R45851 Suicidal ideations: Secondary | ICD-10-CM | POA: Insufficient documentation

## 2021-02-02 DIAGNOSIS — F419 Anxiety disorder, unspecified: Secondary | ICD-10-CM | POA: Insufficient documentation

## 2021-02-02 DIAGNOSIS — F32A Depression, unspecified: Secondary | ICD-10-CM | POA: Insufficient documentation

## 2021-02-02 DIAGNOSIS — F19951 Other psychoactive substance use, unspecified with psychoactive substance-induced psychotic disorder with hallucinations: Secondary | ICD-10-CM | POA: Diagnosis present

## 2021-02-02 DIAGNOSIS — R079 Chest pain, unspecified: Secondary | ICD-10-CM | POA: Insufficient documentation

## 2021-02-02 DIAGNOSIS — F323 Major depressive disorder, single episode, severe with psychotic features: Secondary | ICD-10-CM | POA: Diagnosis present

## 2021-02-02 LAB — CBC WITH DIFFERENTIAL/PLATELET
Abs Immature Granulocytes: 0.01 10*3/uL (ref 0.00–0.07)
Basophils Absolute: 0 10*3/uL (ref 0.0–0.1)
Basophils Relative: 0 %
Eosinophils Absolute: 0 10*3/uL (ref 0.0–0.5)
Eosinophils Relative: 1 %
HCT: 40.7 % (ref 39.0–52.0)
Hemoglobin: 13.7 g/dL (ref 13.0–17.0)
Immature Granulocytes: 0 %
Lymphocytes Relative: 32 %
Lymphs Abs: 1.5 10*3/uL (ref 0.7–4.0)
MCH: 28.3 pg (ref 26.0–34.0)
MCHC: 33.7 g/dL (ref 30.0–36.0)
MCV: 84.1 fL (ref 80.0–100.0)
Monocytes Absolute: 0.4 10*3/uL (ref 0.1–1.0)
Monocytes Relative: 8 %
Neutro Abs: 2.8 10*3/uL (ref 1.7–7.7)
Neutrophils Relative %: 59 %
Platelets: 180 10*3/uL (ref 150–400)
RBC: 4.84 MIL/uL (ref 4.22–5.81)
RDW: 13.2 % (ref 11.5–15.5)
WBC: 4.7 10*3/uL (ref 4.0–10.5)
nRBC: 0 % (ref 0.0–0.2)

## 2021-02-02 LAB — COMPREHENSIVE METABOLIC PANEL
ALT: 31 U/L (ref 0–44)
AST: 51 U/L — ABNORMAL HIGH (ref 15–41)
Albumin: 4.2 g/dL (ref 3.5–5.0)
Alkaline Phosphatase: 60 U/L (ref 38–126)
Anion gap: 9 (ref 5–15)
BUN: 11 mg/dL (ref 6–20)
CO2: 26 mmol/L (ref 22–32)
Calcium: 8.8 mg/dL — ABNORMAL LOW (ref 8.9–10.3)
Chloride: 99 mmol/L (ref 98–111)
Creatinine, Ser: 1.13 mg/dL (ref 0.61–1.24)
GFR, Estimated: >60 mL/min (ref >60)
Glucose, Bld: 117 mg/dL — ABNORMAL HIGH (ref 70–99)
Potassium: 3.8 mmol/L (ref 3.5–5.1)
Sodium: 134 mmol/L — ABNORMAL LOW (ref 135–145)
Total Bilirubin: 1.7 mg/dL — ABNORMAL HIGH (ref 0.3–1.2)
Total Protein: 7.2 g/dL (ref 6.5–8.1)

## 2021-02-02 LAB — SALICYLATE LEVEL: Salicylate Lvl: <7 mg/dL — ABNORMAL LOW (ref 7.0–30.0)

## 2021-02-02 LAB — URINE DRUG SCREEN, QUALITATIVE (ARMC ONLY)
Amphetamines, Ur Screen: NOT DETECTED
Barbiturates, Ur Screen: NOT DETECTED
Benzodiazepine, Ur Scrn: NOT DETECTED
Cannabinoid 50 Ng, Ur ~~LOC~~: POSITIVE — AB
Cocaine Metabolite,Ur ~~LOC~~: POSITIVE — AB
MDMA (Ecstasy)Ur Screen: NOT DETECTED
Methadone Scn, Ur: NOT DETECTED
Opiate, Ur Screen: NOT DETECTED
Phencyclidine (PCP) Ur S: NOT DETECTED
Tricyclic, Ur Screen: NOT DETECTED

## 2021-02-02 LAB — RESP PANEL BY RT-PCR (FLU A&B, COVID) ARPGX2
Influenza A by PCR: NEGATIVE
Influenza B by PCR: NEGATIVE
SARS Coronavirus 2 by RT PCR: NEGATIVE

## 2021-02-02 LAB — TROPONIN I (HIGH SENSITIVITY)
Troponin I (High Sensitivity): 10 ng/L (ref <18)
Troponin I (High Sensitivity): 10 ng/L (ref <18)

## 2021-02-02 LAB — ACETAMINOPHEN LEVEL: Acetaminophen (Tylenol), Serum: <10 ug/mL — ABNORMAL LOW (ref 10–30)

## 2021-02-02 LAB — ETHANOL: Alcohol, Ethyl (B): <10 mg/dL (ref <10)

## 2021-02-02 MED ORDER — NALOXONE HCL 0.4 MG/ML IJ SOLN
0.2000 mg | Freq: Once | INTRAMUSCULAR | Status: AC
  Administered 2021-02-02: 0.2 mg via INTRAVENOUS
  Filled 2021-02-02: qty 1

## 2021-02-02 NOTE — ED Provider Notes (Signed)
[facility] Emergency Department Provider Note ____________________________________________   Event Date/Time   First MD Initiated Contact with Patient 02/02/21 1148     (approximate)  I have reviewed the triage vital signs and the nursing notes.   HISTORY  Chief Complaint Chest Pain  Level 5 caveat: History of present illness limited to due to intoxication  HPI Ezekiel Menzer is a 55 y.o. male with history of polysubstance abuse who presents for what he states is anxiety and depression.  The patient states that has been going on for a long time but has been worse recently.  EMS reports that they were called out for chest pain, however the patient currently denies any chest pain, difficulty breathing, or other medical symptoms.  He does endorse vague SI but denies any specific plan.  He states he has used fentanyl and alcohol but denies other drugs currently.  No past medical history on file.  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  Prior to Admission medications   Medication Sig Start Date End Date Taking? Authorizing Provider  amoxicillin (AMOXIL) 500 MG capsule Take 1 capsule (500 mg total) by mouth every 12 (twelve) hours. Patient not taking: No sig reported 07/19/20   Chesley Noon, MD    Allergies Risperidone  No family history on file.  Social History Social History   Tobacco Use   Smoking status: Every Day    Types: E-cigarettes   Smokeless tobacco: Never  Substance Use Topics   Alcohol use: Yes   Drug use: Yes    Types: Methamphetamines, Marijuana    Review of Systems Level 5 caveat: Unable to obtain review of systems due to  intoxication    ____________________________________________   PHYSICAL EXAM:  VITAL SIGNS: ED Triage Vitals  Enc Vitals Group     BP 02/02/21 1139 (!) 145/96     Pulse Rate 02/02/21 1139 82     Resp 02/02/21 1139 15     Temp 02/02/21 1139 98.5 F (36.9 C)     Temp Source 02/02/21 1139 Oral     SpO2 02/02/21 1139 95 %     Weight 02/02/21 1141 160 lb (72.6 kg)     Height --      Head Circumference --      Peak Flow --      Pain Score --      Pain Loc --      Pain Edu? --      Excl. in GC? --     Constitutional: Somnolent but somewhat arousable.  No acute distress.  Eyes: Conjunctivae are normal.  EOMI.  PERRLA. Head: Atraumatic. Nose: No congestion/rhinnorhea. Mouth/Throat: Mucous membranes are moist.   Neck: Normal range of motion.  Cardiovascular: Normal rate, regular rhythm. Grossly normal heart sounds.  Good peripheral circulation. Respiratory: Somewhat decreased respiratory effort.  No retractions. Lungs CTAB. Gastrointestinal: Soft and nontender. No distention.  Genitourinary: No flank tenderness. Musculoskeletal: No lower extremity edema.  Extremities warm and well perfused.  Neurologic: Slurred speech.  Motor intact in all extremities. Skin:  Skin is warm and dry. No rash noted. Psychiatric: Unable to assess due to drug or alcohol intoxication.  ____________________________________________   LABS (all labs ordered are listed,  but only abnormal results are displayed)  Labs Reviewed  COMPREHENSIVE METABOLIC PANEL - Abnormal; Notable for the following components:      Result Value   Sodium 134 (*)    Glucose, Bld 117 (*)    Calcium 8.8 (*)    AST 51 (*)    Total Bilirubin 1.7 (*)    All other components within normal limits  ACETAMINOPHEN LEVEL - Abnormal; Notable for the following components:   Acetaminophen (Tylenol), Serum <10 (*)    All other components within normal limits  SALICYLATE LEVEL - Abnormal; Notable for the following components:    Salicylate Lvl <7.0 (*)    All other components within normal limits  URINE DRUG SCREEN, QUALITATIVE ([facility] ONLY) - Abnormal; Notable for the following components:   Cocaine Metabolite,Ur [place] POSITIVE (*)    Cannabinoid 50 Ng, Ur [place] POSITIVE (*)    All other components within normal limits  CBC WITH DIFFERENTIAL/PLATELET  ETHANOL  TROPONIN I (HIGH SENSITIVITY)  TROPONIN I (HIGH SENSITIVITY)   ____________________________________________  EKG  ED ECG REPORT I, Dionne Bucy, the attending physician, personally viewed and interpreted this ECG.  Date: 02/02/2021 EKG Time: 1133 Rate: 80 Rhythm: normal sinus rhythm QRS Axis: normal Intervals: normal ST/T Wave abnormalities: normal Narrative Interpretation: no evidence of acute ischemia  ____________________________________________  RADIOLOGY    ____________________________________________   PROCEDURES  Procedure(s) performed: No  Procedures  Critical Care performed: No ____________________________________________   INITIAL IMPRESSION / ASSESSMENT AND PLAN / ED COURSE  Pertinent labs & imaging results that were available during my care of the patient were reviewed by me and considered in my medical decision making (see chart for details).   55 year old male with PMH as noted above including a history of polysubstance abuse, major depression, and substance induced psychotic disorder presents stating he has had increased anxiety and depression.  EMS was called out for chest pain but the patient denies this currently.  He does endorse vague SI and admits to fentanyl and alcohol use today.  I reviewed the past medical records in Epic.  The patient was most recently seen in the ED and observed for several days by psychiatry in February due to polysubstance abuse and SI.  He was ultimately cleared to go home.  On exam currently, the patient is somnolent but arousable.  He does not fully stay awake but is able to answer a  few limited questions before falling back asleep.  He has decreased respiratory effort but is breathing spontaneously and maintaining an O2 saturation in the high 80s on room air and mid 90s on 2 L.  Neurologic exam is nonfocal.  There is no visible trauma.  Overall presentation is consistent with opiate intoxication, versus other substance abuse.  We will give a dose of Narcan given the patient's borderline respiratory status although he likely will not need a Narcan infusion.  We will obtain labs, continue to monitor the patient, and then have psychiatry see him when he is medically cleared.  Due to the SI I have placed him under involuntary commitment.  ----------------------------------------- 3:54 PM on 02/02/2021 -----------------------------------------  UDS is positive for cocaine and cannabinoids.  Fentanyl may not show up on this.  Overall presentation is still most consistent with opiate overdose.  The patient has not required any additional Narcan and is maintaining his own airway and adequate respiratory status.  Lab work-up is unremarkable.  Plan will be for psychiatry consultation once medically cleared.  I signed the patient out  to the oncoming ED physician Dr. Larinda Buttery.  ________________________  The patient has been placed in psychiatric observation due to the need to provide a safe environment for the patient while obtaining psychiatric consultation and evaluation, as well as ongoing medical and medication management to treat the patient's condition.  The patient has been placed under full IVC at this time.    ____________________________________________   FINAL CLINICAL IMPRESSION(S) / ED DIAGNOSES  Final diagnoses:  Polysubstance abuse (HCC)  Suicidal ideation      NEW MEDICATIONS STARTED DURING THIS VISIT:  New Prescriptions   No medications on file     Note:  This document was prepared using Dragon voice recognition software and may include unintentional  dictation errors.    Dionne Bucy, MD 02/02/21 [phone]

## 2021-02-02 NOTE — ED Notes (Signed)
Pt continues to sleep with sitter at the bedside.

## 2021-02-02 NOTE — ED Provider Notes (Signed)
-----------------------------------------   3:32 PM on 02/02/2021 -----------------------------------------  Blood pressure 123/75, pulse 65, temperature 98.5 F (36.9 C), temperature source Oral, resp. rate 15, weight 72.6 kg, SpO2 100 %.  Assuming care from Dr. Marisa Severin.  In short, Kenneth Yang is a 55 y.o. male with a chief complaint of Chest Pain .  Refer to the original H&P for additional details.  The current plan of care is to monitor until clinically sober following opiate overdose, pending psych evaluation for SI.  ED ECG REPORT I, Chesley Noon, the attending physician, personally viewed and interpreted this ECG.   Date: 02/02/2021  EKG Time: 16:27  Rate: 58  Rhythm: normal sinus rhythm  Axis: Normal  Intervals:none  ST&T Change: None  Patient now appears clinically sober, did complain of some chest pain however EKG shows no evidence of arrhythmia or ischemia and 2 troponins are negative.  Chest x-ray reviewed by me and shows no infiltrate, edema, or effusion.  He is now clinically sober and maintaining O2 sats on room air.  He has been evaluated by psychiatry, who will seek inpatient admission.  Patient may be medically cleared for psychiatric disposition.     Chesley Noon, MD 02/02/21 [phone]

## 2021-02-02 NOTE — ED Notes (Signed)
PT  PLACED  UNDER  IVC  PAPERS  PER  DR  Marisa Severin  MD  INFORMED  Kenneth Housekeeper  RN

## 2021-02-02 NOTE — ED Notes (Signed)
IVC/  PENDING  PLACEMENT 

## 2021-02-02 NOTE — BH Assessment (Signed)
PATIENT BED AVAILABLE WHEN TRANSPORTATION IS AVAILABLE, LIKELY ON 02/03/21  Patient has been accepted to [facility].  Patient assigned to room 302 Bed 1 Accepting physician is Dorena Bodo NP.  Attending physician is Dr. Lucianne Muss Call report to [phone]  Representative was Malva Limes RN.   ER Staff is aware of it:  [facility] ER Secretary  Dr. Larinda Buttery, ER MD  Apolinar Junes Patient's Nurse

## 2021-02-02 NOTE — ED Notes (Signed)
IVC/pending transport in the AM.

## 2021-02-02 NOTE — Consult Note (Signed)
[facility] Face-to-Face Psychiatry Consult   Reason for Consult: Consult for 55 year old man came voluntarily to the emergency room with complaints of chest pain now reporting psychotic and depression symptoms Referring Physician: Su Hoff Patient Identification: Kenneth Yang MRN:  [id number] Principal Diagnosis: Major depression with psychotic features [facility]) Diagnosis:  Principal Problem:   Major depression with psychotic features (HCC) Active Problems:   Substance-induced psychotic disorder with hallucinations (HCC)   Cocaine abuse (HCC)   Total Time spent with patient: 1 hour  Subjective:   Kenneth Yang is a 55 y.o. male patient admitted with "I hear things and see things".  HPI: Patient seen chart reviewed.  Patient known from prior encounters.  55 year old man with a history of substance abuse problems came to the emergency room initially complaining of chest pain and saying that he had been shooting up fentanyl.  Patient not found to have any cardiac problems now stating he is having hallucinations and suicidal ideation.  Patient was only partially cooperative with the interview.  Would not make eye contact.  Kept his arm in front of his face and moderate into his mask.  Told me he is hearing things and seeing things and it has been going on for a few weeks.  He got thrown out by his girlfriend a couple weeks ago and since then has been using drugs and drinking.  He says he has been drinking "a lot" and using cocaine and Fentanyl.  Drug screen positive for cocaine and cannabis.  No alcohol on board.  Patient reports suicidal ideation and hopelessness depressed mood poor sleep.  Past Psychiatric History: History of substance abuse and presentations to the ER often with dramatic symptoms including psychotic symptoms when intoxicated.  Usually after a couple of days it clears up.  Minimal participation in outpatient treatment.  No known prior suicide attempts  Risk to Self:   Risk to  Others:   Prior Inpatient Therapy:   Prior Outpatient Therapy:    Past Medical History: No past medical history on file. No past surgical history on file. Family History: No family history on file. Family Psychiatric  History: Does not know of any Social History:  Social History   Substance and Sexual Activity  Alcohol Use Yes     Social History   Substance and Sexual Activity  Drug Use Yes  . Types: Methamphetamines, Marijuana    Social History   Socioeconomic History  . Marital status: Single    Spouse name: Not on file  . Number of children: Not on file  . Years of education: Not on file  . Highest education level: Not on file  Occupational History  . Not on file  Tobacco Use  . Smoking status: Every Day    Types: E-cigarettes  . Smokeless tobacco: Never  Substance and Sexual Activity  . Alcohol use: Yes  . Drug use: Yes    Types: Methamphetamines, Marijuana  . Sexual activity: Not on file  Other Topics Concern  . Not on file  Social History Narrative  . Not on file   Social Determinants of Health   Financial Resource Strain: Not on file  Food Insecurity: Not on file  Transportation Needs: Not on file  Physical Activity: Not on file  Stress: Not on file  Social Connections: Not on file   Additional Social History:    Allergies:   Allergies  Allergen Reactions  . Risperidone Other (See Comments)    Tongue EPS, drooling    Labs:  Results for orders placed or performed during the hospital encounter of 02/02/21 (from the past 48 hour(s))  CBC with Differential     Status: None   Collection Time: 02/02/21 11:55 AM  Result Value Ref Range   WBC 4.7 4.0 - 10.5 K/uL   RBC 4.84 4.22 - 5.81 MIL/uL   Hemoglobin 13.7 13.0 - 17.0 g/dL   HCT 28.440.7 13.239.0 - 44.052.0 %   MCV 84.1 80.0 - 100.0 fL   MCH 28.3 26.0 - 34.0 pg   MCHC 33.7 30.0 - 36.0 g/dL   RDW 10.213.2 72.511.5 - 36.615.5 %   Platelets 180 150 - 400 K/uL   nRBC 0.0 0.0 - 0.2 %   Neutrophils Relative % 59 %    Neutro Abs 2.8 1.7 - 7.7 K/uL   Lymphocytes Relative 32 %   Lymphs Abs 1.5 0.7 - 4.0 K/uL   Monocytes Relative 8 %   Monocytes Absolute 0.4 0.1 - 1.0 K/uL   Eosinophils Relative 1 %   Eosinophils Absolute 0.0 0.0 - 0.5 K/uL   Basophils Relative 0 %   Basophils Absolute 0.0 0.0 - 0.1 K/uL   Immature Granulocytes 0 %   Abs Immature Granulocytes 0.01 0.00 - 0.07 K/uL    Comment: Performed at [facility], [address] [place], [place] [address]  Troponin I (High Sensitivity)     Status: None   Collection Time: 02/02/21 11:55 AM  Result Value Ref Range   Troponin I (High Sensitivity) 10 <18 ng/L    Comment: (NOTE) Elevated high sensitivity troponin I (hsTnI) values and significant  changes across serial measurements may suggest ACS but many other  chronic and acute conditions are known to elevate hsTnI results.  Refer to the "Links" section for chest pain algorithms and additional  guidance. Performed at [facility], [address] [place], [place] [address]   Comprehensive metabolic panel     Status: Abnormal   Collection Time: 02/02/21 11:55 AM  Result Value Ref Range   Sodium 134 (L) 135 - 145 mmol/L   Potassium 3.8 3.5 - 5.1 mmol/L   Chloride 99 98 - 111 mmol/L   CO2 26 22 - 32 mmol/L   Glucose, Bld 117 (H) 70 - 99 mg/dL    Comment: Glucose reference range applies only to samples taken after fasting for at least 8 hours.   BUN 11 6 - 20 mg/dL   Creatinine, Ser 5.631.13 0.61 - 1.24 mg/dL   Calcium 8.8 (L) 8.9 - 10.3 mg/dL   Total Protein 7.2 6.5 - 8.1 g/dL   Albumin 4.2 3.5 - 5.0 g/dL   AST 51 (H) 15 - 41 U/L   ALT 31 0 - 44 U/L   Alkaline Phosphatase 60 38 - 126 U/L   Total Bilirubin 1.7 (H) 0.3 - 1.2 mg/dL   GFR, Estimated >87>60 >56>60 mL/min    Comment: (NOTE) Calculated using the CKD-EPI Creatinine Equation (2021)    Anion gap 9 5 - 15    Comment: Performed at [facility], [address] [place], [place] [address]  Acetaminophen level      Status: Abnormal   Collection Time: 02/02/21 11:55 AM  Result Value Ref Range   Acetaminophen (Tylenol), Serum <10 (L) 10 - 30 ug/mL    Comment: (NOTE) Therapeutic concentrations vary significantly. A range of 10-30 ug/mL  may be an effective concentration for many patients. However, some  are best treated at concentrations outside of this range. Acetaminophen concentrations >  150 ug/mL at 4 hours after ingestion  and >50 ug/mL at 12 hours after ingestion are often associated with  toxic reactions.  Performed at [facility], [address] [place], [place] [address]   Salicylate level     Status: Abnormal   Collection Time: 02/02/21 11:55 AM  Result Value Ref Range   Salicylate Lvl <7.0 (L) 7.0 - 30.0 mg/dL    Comment: Performed at [facility], [address] [place], [place] [address]  Ethanol     Status: None   Collection Time: 02/02/21 11:55 AM  Result Value Ref Range   Alcohol, Ethyl (B) <10 <10 mg/dL    Comment: (NOTE) Lowest detectable limit for serum alcohol is 10 mg/dL.  For medical purposes only. Performed at [facility], [address] [place], [place] [address]   Urine Drug Screen, Qualitative     Status: Abnormal   Collection Time: 02/02/21 11:55 AM  Result Value Ref Range   Tricyclic, Ur Screen NONE DETECTED NONE DETECTED   Amphetamines, Ur Screen NONE DETECTED NONE DETECTED   MDMA (Ecstasy)Ur Screen NONE DETECTED NONE DETECTED   Cocaine Metabolite,Ur [place] POSITIVE (A) NONE DETECTED   Opiate, Ur Screen NONE DETECTED NONE DETECTED   Phencyclidine (PCP) Ur S NONE DETECTED NONE DETECTED   Cannabinoid 50 Ng, Ur [place] POSITIVE (A) NONE DETECTED   Barbiturates, Ur Screen NONE DETECTED NONE DETECTED   Benzodiazepine, Ur Scrn NONE DETECTED NONE DETECTED   Methadone Scn, Ur NONE DETECTED NONE DETECTED    Comment: (NOTE) Tricyclics + metabolites, urine    Cutoff 1000 ng/mL Amphetamines + metabolites, urine  Cutoff 1000 ng/mL MDMA  (Ecstasy), urine              Cutoff 500 ng/mL Cocaine Metabolite, urine          Cutoff 300 ng/mL Opiate + metabolites, urine        Cutoff 300 ng/mL Phencyclidine (PCP), urine         Cutoff 25 ng/mL Cannabinoid, urine                 Cutoff 50 ng/mL Barbiturates + metabolites, urine  Cutoff 200 ng/mL Benzodiazepine, urine              Cutoff 200 ng/mL Methadone, urine                   Cutoff 300 ng/mL  The urine drug screen provides only a preliminary, unconfirmed analytical test result and should not be used for non-medical purposes. Clinical consideration and professional judgment should be applied to any positive drug screen result due to possible interfering substances. A more specific alternate chemical method must be used in order to obtain a confirmed analytical result. Gas chromatography / mass spectrometry (GC/MS) is the preferred confirm atory method. Performed at [facility], [address] [place], [place] [address]   Troponin I (High Sensitivity)     Status: None   Collection Time: 02/02/21  2:52 PM  Result Value Ref Range   Troponin I (High Sensitivity) 10 <18 ng/L    Comment: (NOTE) Elevated high sensitivity troponin I (hsTnI) values and significant  changes across serial measurements may suggest ACS but many other  chronic and acute conditions are known to elevate hsTnI results.  Refer to the "Links" section for chest pain algorithms and additional  guidance. Performed at [facility], [address], [place], [place] [address]     No current facility-administered  medications for this encounter.   Current Outpatient Medications  Medication Sig Dispense Refill  . amoxicillin (AMOXIL) 500 MG capsule Take 1 capsule (500 mg total) by mouth every 12 (twelve) hours. (Patient not taking: No sig reported) 8 capsule 0    Musculoskeletal: Strength & Muscle Tone: within normal limits Gait & Station: normal Patient leans:  N/A            Psychiatric Specialty Exam:  Presentation  General Appearance:  No data recorded Eye Contact: No data recorded Speech: No data recorded Speech Volume: No data recorded Handedness: No data recorded  Mood and Affect  Mood: No data recorded Affect: No data recorded  Thought Process  Thought Processes: No data recorded Descriptions of Associations:No data recorded Orientation:No data recorded Thought Content:No data recorded History of Schizophrenia/Schizoaffective disorder:No  Duration of Psychotic Symptoms:No data recorded Hallucinations:No data recorded Ideas of Reference:No data recorded Suicidal Thoughts:No data recorded Homicidal Thoughts:No data recorded  Sensorium  Memory: No data recorded Judgment: No data recorded Insight: No data recorded  Executive Functions  Concentration: No data recorded Attention Span: No data recorded Recall: No data recorded Fund of Knowledge: No data recorded Language: No data recorded  Psychomotor Activity  Psychomotor Activity: No data recorded  Assets  Assets: No data recorded  Sleep  Sleep: No data recorded  Physical Exam: Physical Exam Vitals and nursing note reviewed.  Constitutional:      Appearance: Normal appearance.  HENT:     Head: Normocephalic and atraumatic.     Mouth/Throat:     Pharynx: Oropharynx is clear.  Eyes:     Pupils: Pupils are equal, round, and reactive to light.  Cardiovascular:     Rate and Rhythm: Normal rate and regular rhythm.  Pulmonary:     Effort: Pulmonary effort is normal.     Breath sounds: Normal breath sounds.  Abdominal:     General: Abdomen is flat.     Palpations: Abdomen is soft.  Musculoskeletal:        General: Normal range of motion.  Skin:    General: Skin is warm and dry.  Neurological:     General: No focal deficit present.     Mental Status: He is alert. Mental status is at baseline.  Psychiatric:        Attention and  Perception: He is inattentive. He perceives auditory hallucinations.        Mood and Affect: Mood normal. Affect is blunt.        Speech: Speech is slurred.        Behavior: Behavior is slowed.        Thought Content: Thought content is paranoid. Thought content includes suicidal ideation.        Cognition and Memory: Cognition is impaired.        Judgment: Judgment is impulsive.   Review of Systems  Constitutional: Negative.   HENT: Negative.    Eyes: Negative.   Respiratory: Negative.    Cardiovascular: Negative.   Gastrointestinal: Negative.   Musculoskeletal: Negative.   Skin: Negative.   Neurological: Negative.   Psychiatric/Behavioral:  Positive for depression, hallucinations, memory loss, substance abuse and suicidal ideas. The patient is nervous/anxious and has insomnia.   Blood pressure 123/75, pulse 65, temperature 98.5 F (36.9 C), temperature source Oral, resp. rate 15, weight 72.6 kg, SpO2 100 %. Body mass index is 24.33 kg/m.  Treatment Plan Summary: Medication management and Plan patient is still on oxygen right now but probably will be  able to come off that once he wakes up a little more.  Otherwise medically stable.  Patient would be appropriate for admission to inpatient psychiatric unit.  No beds currently available.  Recommend that he be referred out to other hospitals for treatment.  Disposition: Recommend psychiatric Inpatient admission when medically cleared.  Mordecai Rasmussen, MD 02/02/2021 4:17 PM

## 2021-02-02 NOTE — ED Notes (Signed)
This Clinical research associate as 1:1 sitter, pt asleep at this time.

## 2021-02-02 NOTE — ED Triage Notes (Signed)
Pt arrived via EMS for chest pain. Pt sts that he was smoking fentanyl and started to have chest pain. Pt is lethargic at this time but is able to maintain his airway.

## 2021-02-02 NOTE — BH Assessment (Signed)
Writer unable to consult. Patient was unable to participate in the interview.

## 2021-02-02 NOTE — ED Notes (Signed)
Food tray given with apple juice.

## 2021-02-03 ENCOUNTER — Other Ambulatory Visit: Payer: Self-pay

## 2021-02-03 ENCOUNTER — Inpatient Hospital Stay (HOSPITAL_COMMUNITY)
Admission: AD | Admit: 2021-02-03 | Discharge: 2021-02-08 | DRG: 897 | Payer: Federal, State, Local not specified - Other | Source: Intra-hospital | Attending: Psychiatry | Admitting: Psychiatry

## 2021-02-03 ENCOUNTER — Encounter (HOSPITAL_COMMUNITY): Payer: Self-pay | Admitting: Family Medicine

## 2021-02-03 DIAGNOSIS — Z5900 Homelessness unspecified: Secondary | ICD-10-CM

## 2021-02-03 DIAGNOSIS — F411 Generalized anxiety disorder: Secondary | ICD-10-CM | POA: Diagnosis present

## 2021-02-03 DIAGNOSIS — Z79899 Other long term (current) drug therapy: Secondary | ICD-10-CM

## 2021-02-03 DIAGNOSIS — K219 Gastro-esophageal reflux disease without esophagitis: Secondary | ICD-10-CM | POA: Diagnosis present

## 2021-02-03 DIAGNOSIS — F1420 Cocaine dependence, uncomplicated: Secondary | ICD-10-CM | POA: Diagnosis present

## 2021-02-03 DIAGNOSIS — G4700 Insomnia, unspecified: Secondary | ICD-10-CM | POA: Diagnosis present

## 2021-02-03 DIAGNOSIS — F1210 Cannabis abuse, uncomplicated: Secondary | ICD-10-CM | POA: Diagnosis present

## 2021-02-03 DIAGNOSIS — F333 Major depressive disorder, recurrent, severe with psychotic symptoms: Secondary | ICD-10-CM | POA: Diagnosis present

## 2021-02-03 DIAGNOSIS — F17290 Nicotine dependence, other tobacco product, uncomplicated: Secondary | ICD-10-CM | POA: Diagnosis present

## 2021-02-03 DIAGNOSIS — F19951 Other psychoactive substance use, unspecified with psychoactive substance-induced psychotic disorder with hallucinations: Secondary | ICD-10-CM | POA: Diagnosis present

## 2021-02-03 DIAGNOSIS — R45851 Suicidal ideations: Secondary | ICD-10-CM | POA: Diagnosis present

## 2021-02-03 DIAGNOSIS — R440 Auditory hallucinations: Secondary | ICD-10-CM | POA: Diagnosis present

## 2021-02-03 DIAGNOSIS — F1410 Cocaine abuse, uncomplicated: Secondary | ICD-10-CM | POA: Diagnosis not present

## 2021-02-03 MED ORDER — ACETAMINOPHEN 325 MG PO TABS: 650.0000 mg | ORAL_TABLET | Freq: Four times a day (QID) | ORAL | Status: DC | PRN

## 2021-02-03 MED ORDER — ALUM & MAG HYDROXIDE-SIMETH 200-200-20 MG/5ML PO SUSP
30.0000 mL | ORAL | Status: DC | PRN
Start: 2021-02-03 — End: 2021-02-08

## 2021-02-03 MED ORDER — HYDROXYZINE HCL 25 MG PO TABS
25.0000 mg | ORAL_TABLET | Freq: Three times a day (TID) | ORAL | Status: DC | PRN
  Administered 2021-02-05 – 2021-02-06 (×2): 25 mg via ORAL
  Filled 2021-02-03 (×3): qty 1

## 2021-02-03 MED ORDER — TRAZODONE HCL 50 MG PO TABS
50.0000 mg | ORAL_TABLET | Freq: Every evening | ORAL | Status: DC | PRN
  Filled 2021-02-03 (×2): qty 1

## 2021-02-03 MED ORDER — MAGNESIUM HYDROXIDE 400 MG/5ML PO SUSP: 30.0000 mL | Freq: Every day | ORAL | Status: DC | PRN

## 2021-02-03 NOTE — ED Notes (Signed)
Attempted x2 to call report to [facility] [facility]. Facility not answering the phone at this time. Charge RN [employer] notified.

## 2021-02-03 NOTE — ED Notes (Signed)
Pt. Given [place] sandwich tray, gingerale, and ice water.

## 2021-02-03 NOTE — ED Notes (Signed)
Pt. Transported to ED Rm. 25

## 2021-02-03 NOTE — ED Notes (Signed)
Attempted to call report to [facility], unable to at this time. Advised to call back shortly.

## 2021-02-03 NOTE — Tx Team (Signed)
Initial Treatment Plan 02/03/2021 4:51 PM Kenneth Yang [id number]    PATIENT STRESSORS: Financial difficulties Loss of property Substance abuse Traumatic event   PATIENT STRENGTHS: Communication skills Motivation for treatment/growth   PATIENT IDENTIFIED PROBLEMS: Suicidal ideation  Substance use  Depression   "I need to get some help"               DISCHARGE CRITERIA:  Improved stabilization in mood, thinking, and/or behavior Verbal commitment to aftercare and medication compliance Withdrawal symptoms are absent or subacute and managed without 24-hour nursing intervention  PRELIMINARY DISCHARGE PLAN: Outpatient therapy Placement in alternative living arrangements  PATIENT/FAMILY INVOLVEMENT: This treatment plan has been presented to and reviewed with the patient, Kenneth Yang.  The patient has been given the opportunity to ask questions and make suggestions.  Garnette Scheuermann, RN 02/03/2021, 4:51 PM
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
## 2021-02-03 NOTE — Progress Notes (Signed)
Pt refused his labs this evening.

## 2021-02-03 NOTE — ED Provider Notes (Signed)
Emergency Medicine Observation Re-evaluation Note  Kenneth Yang is a 55 y.o. male, seen on rounds today.  Pt initially presented to the ED for complaints of Chest Pain Currently, the patient is resting.  Physical Exam  BP 124/85 (BP Location: Left Arm)   Pulse 64   Temp 98.3 F (36.8 C) (Oral)   Resp 16   Wt 72.6 kg   SpO2 96%   BMI 24.33 kg/m  Physical Exam Gen:  No acute distress Resp:  Breathing easily and comfortably, no accessory muscle usage Neuro:  Moving all four extremities, no gross focal neuro deficits Psych:  Resting currently, calm when awake  ED Course / MDM  EKG:   I have reviewed the labs performed to date as well as medications administered while in observation.  Recent changes in the last 24 hours include initial evaluation.  Plan  Current plan is for psych disposition. Theodoro Doing is under involuntary commitment.      Loleta Rose, MD 02/03/21 [phone]

## 2021-02-03 NOTE — ED Notes (Signed)
Pt given lunch tray.

## 2021-02-04 DIAGNOSIS — F1210 Cannabis abuse, uncomplicated: Secondary | ICD-10-CM | POA: Diagnosis present

## 2021-02-04 DIAGNOSIS — F19951 Other psychoactive substance use, unspecified with psychoactive substance-induced psychotic disorder with hallucinations: Principal | ICD-10-CM

## 2021-02-04 DIAGNOSIS — F1410 Cocaine abuse, uncomplicated: Secondary | ICD-10-CM | POA: Diagnosis not present

## 2021-02-04 DIAGNOSIS — F1420 Cocaine dependence, uncomplicated: Secondary | ICD-10-CM

## 2021-02-04 DIAGNOSIS — F333 Major depressive disorder, recurrent, severe with psychotic symptoms: Secondary | ICD-10-CM

## 2021-02-04 LAB — LIPID PANEL
Cholesterol: 145 mg/dL (ref 0–200)
HDL: 55 mg/dL (ref >40)
LDL Cholesterol: 70 mg/dL (ref 0–99)
Total CHOL/HDL Ratio: 2.6 RATIO
Triglycerides: 98 mg/dL (ref <150)
VLDL: 20 mg/dL (ref 0–40)

## 2021-02-04 LAB — HEMOGLOBIN A1C
Hgb A1c MFr Bld: 6.3 % — ABNORMAL HIGH (ref 4.8–5.6)
Mean Plasma Glucose: 134.11 mg/dL

## 2021-02-04 LAB — TSH: TSH: 0.818 u[IU]/mL (ref 0.350–4.500)

## 2021-02-04 MED ORDER — ARIPIPRAZOLE 5 MG PO TABS
5.0000 mg | ORAL_TABLET | Freq: Every day | ORAL | Status: DC
  Administered 2021-02-04 – 2021-02-07 (×3): 5 mg via ORAL
  Filled 2021-02-04: qty 14
  Filled 2021-02-04: qty 1
  Filled 2021-02-04: qty 14
  Filled 2021-02-04 (×6): qty 1

## 2021-02-04 MED ORDER — FLUOXETINE HCL 20 MG PO CAPS
20.0000 mg | ORAL_CAPSULE | Freq: Every day | ORAL | Status: DC
  Administered 2021-02-04 – 2021-02-07 (×3): 20 mg via ORAL
  Filled 2021-02-04 (×2): qty 1
  Filled 2021-02-04 (×2): qty 14
  Filled 2021-02-04 (×5): qty 1

## 2021-02-04 NOTE — Progress Notes (Addendum)
D: Patient has been in bed majority of the morning. He did get up when provider asked to speak to him. He has abilify and prozac ordered. I asked him to come to the medication window and he refused stating, "I'll be there in a minute." Patient then covered his head and rolled over. He denies any thoughts of self harm. Patient appears to be responding to internal stimuli.  A: Continue to monitor medication management and MD orders.  Safety checks completed every 15 minutes per protocol.  Offer support and encouragement as needed.  R: Patient remains isolative to room. He is not participating in any milieu activities or group.       02/04/21 1100  Psych Admission Type (Psych Patients Only)  Admission Status Involuntary  Psychosocial Assessment  Patient Complaints Depression;Irritability  Eye Contact Avoids  Facial Expression Flat;Angry  Affect Blunted  Speech Unremarkable  Interaction Avoidant  Motor Activity Other (Comment) (stayed in bed until provider asked to speak to him)  Appearance/Hygiene Unremarkable  Behavior Characteristics Unwilling to participate  Mood Irritable;Depressed  Thought Process  Coherency WDL  Content Ambivalence  Delusions None reported or observed  Perception WDL  Hallucination None reported or observed  Judgment Poor  Confusion None  Danger to Self  Current suicidal ideation? Denies  Danger to Others  Danger to Others None reported or observed

## 2021-02-04 NOTE — Progress Notes (Signed)
     02/04/21 0100  Psych Admission Type (Psych Patients Only)  Admission Status Involuntary  Psychosocial Assessment  Patient Complaints Anxiety;Depression;Irritability  Eye Contact Brief  Facial Expression Angry  Affect Blunted  Speech Logical/coherent  Interaction Assertive  Motor Activity Other (Comment) (wnl)  Appearance/Hygiene Unremarkable  Behavior Characteristics Unwilling to participate;Anxious;Irritable  Mood Irritable;Depressed;Anxious  Thought Process  Coherency WDL  Content WDL  Delusions WDL  Perception WDL  Hallucination None reported or observed  Judgment Poor  Confusion None  Danger to Self  Current suicidal ideation? Denies  Danger to Others  Danger to Others None reported or observed

## 2021-02-04 NOTE — [facility] Group Notes (Signed)
Did not attend group 

## 2021-02-04 NOTE — Progress Notes (Signed)
Kenneth Yang, patient, declined to provide written consent for CSW team to coordinate aftercare at this time. CSW to have further conversations with patient regarding care in the community.   CSW will follow up at a later time to discuss SUD residential treatment and make additional attempts to obtain consent.   Signed:  Corky Crafts, MSW, [place], LCASA 02/04/2021 11:56 AM

## 2021-02-04 NOTE — Progress Notes (Signed)
Patient ID: Kenneth Yang, male   DOB: 1966/05/08, 55 y.o.   MRN: [id number] Patient in room with covers over his head. Vitals completed this am. He was not due any medication this am. Patient isolates to room.

## 2021-02-04 NOTE — Plan of Care (Signed)
  Problem: Education: Goal: Emotional status will improve Outcome: Not Progressing Goal: Mental status will improve Outcome: Not Progressing Goal: Verbalization of understanding the information provided will improve Outcome: Not Progressing   

## 2021-02-04 NOTE — Progress Notes (Signed)
   02/04/21 2154  Psych Admission Type (Psych Patients Only)  Admission Status Involuntary  Psychosocial Assessment  Patient Complaints Irritability  Eye Contact Avoids  Facial Expression Flat  Affect Blunted  Speech Unremarkable  Interaction Isolative  Motor Activity Other (Comment) (WDL)  Appearance/Hygiene Unremarkable  Behavior Characteristics Unwilling to participate  Mood Depressed;Irritable  Thought Process  Coherency WDL  Content Ambivalence  Delusions None reported or observed  Perception WDL  Hallucination Auditory  Judgment Poor  Confusion None  Danger to Self  Current suicidal ideation? Denies  Danger to Others  Danger to Others None reported or observed
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
## 2021-02-04 NOTE — [facility] Group Notes (Signed)
LCSW Group Therapy Note  02/04/2021  Type of Therapy and Topic:  Group Therapy - Healthy vs Unhealthy Coping Skills  Participation Level:  Active   Description of Group The focus of this group was to determine what unhealthy coping techniques typically are used by group members and what healthy coping techniques would be helpful in coping with various problems. Patients were guided in becoming aware of the differences between healthy and unhealthy coping techniques. Patients were asked to identify 2-3 healthy coping skills they would like to learn to use more effectively, and many mentioned meditation, breathing, and relaxation. These were explained, samples demonstrated, and resources shared for how to learn more at discharge. At group closing, additional ideas of healthy coping skills were shared in a fun exercise.  Therapeutic Goals Patients learned that coping is what human beings do all day long to deal with various situations in their lives Patients defined and discussed healthy vs unhealthy coping techniques Patients identified their preferred coping techniques and identified whether these were healthy or unhealthy Patients determined 2-3 healthy coping skills they would like to become more familiar with and use more often, and practiced a few medications Patients provided support and ideas to each other   Summary of Patient Progress:  Pt was given packet of worksheets discussing coping skills. Pt was encouraged to seek out CSW if any questions arose while completing the worksheets.    Therapeutic Modalities Cognitive Behavioral Therapy Motivational Interviewing  Jaton Eilers, LCSWA Clinicial Social Worker [facility]   
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
## 2021-02-04 NOTE — [facility] Suicide Risk Assessment (Signed)
[facility] INPATIENT:  Family/Significant Other Suicide Prevention Education  Suicide Prevention Education:  Patient Refusal for Family/Significant Other Suicide Prevention Education: The patient Kenneth Yang has refused to provide written consent for family/significant other to be provided Family/Significant Other Suicide Prevention Education during admission and/or prior to discharge.  Physician notified.  Safety planning completed with patient during assessment.   Corky Crafts 02/04/2021, 11:56 AM

## 2021-02-04 NOTE — [facility] Group Notes (Signed)
Patient did not participate in group

## 2021-02-05 DIAGNOSIS — F19951 Other psychoactive substance use, unspecified with psychoactive substance-induced psychotic disorder with hallucinations: Secondary | ICD-10-CM | POA: Diagnosis not present

## 2021-02-05 MED ORDER — PANTOPRAZOLE SODIUM 40 MG PO TBEC
40.0000 mg | DELAYED_RELEASE_TABLET | Freq: Every day | ORAL | Status: DC
  Administered 2021-02-06 – 2021-02-07 (×2): 40 mg via ORAL
  Filled 2021-02-05 (×5): qty 1

## 2021-02-05 MED ORDER — PANTOPRAZOLE SODIUM 40 MG PO TBEC
40.0000 mg | DELAYED_RELEASE_TABLET | Freq: Once | ORAL | Status: AC
Start: 2021-02-05 — End: 2021-02-05
  Administered 2021-02-05: 40 mg via ORAL
  Filled 2021-02-05: qty 1

## 2021-02-05 NOTE — [facility] Group Notes (Signed)
Patient did not attend the Psycho-Ed group. 
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
## 2021-02-05 NOTE — Progress Notes (Signed)
   02/05/21 2308  Psych Admission Type (Psych Patients Only)  Admission Status Involuntary  Psychosocial Assessment  Patient Complaints Irritability  Eye Contact Avoids  Facial Expression Flat  Affect Blunted  Speech Unremarkable  Interaction Isolative  Motor Activity Other (Comment) (WDL)  Appearance/Hygiene Unremarkable  Behavior Characteristics Appropriate to situation  Mood Depressed;Irritable  Thought Process  Coherency WDL  Content Ambivalence  Delusions None reported or observed  Perception Hallucinations  Hallucination Auditory;Visual  Judgment Poor  Confusion None  Danger to Self  Current suicidal ideation? Denies  Danger to Others  Danger to Others None reported or observed

## 2021-02-05 NOTE — BHH Group Notes (Signed)
LCSW Group Therapy Notes    Type of Therapy and Topic: Group Therapy: Effective Communication   Participation Level: Active   Description of Group:  In this group patients will be asked to identify their own styles of communication as well as defining and identifying passive, assertive, and aggressive styles of communication. Participants will identify strategies to communicate in a more assertive manner in an effort to appropriately meet their needs. This group will be process-oriented, with patients participating in exploration of their own experiences as well as giving and receiving support and challenge from other group members.   Therapeutic Goals: 1. Patient will identify their personal communication style. 2. Patient will identify passive, assertive, and aggressive forms of communication. 3. Patient will identify strategies for developing more effective communication to appropriately meet their needs.      Summary of Patient Progress:   This group has been supplemented with worksheets. Patient was given opportunity to meet with CSW one on one to review worksheets.     Therapeutic Modalities:  Communication Skills Solution Focused Therapy Motivational Interviewing     Grason Brailsford MSW, LCSW Clincal Social Worker  [facility] 

## 2021-02-05 NOTE — Progress Notes (Addendum)
D:  Patient denied SI and HI, contracts for safety.  Denied A/V hallucinations.  Denied pain. A:  Patient refused his morning medications,   abilify 5 mg and prozac 20 mg.  MD informed.    Emotional support and encouragement given patient. R:  Safety maintained with 15 minutre checks.

## 2021-02-05 NOTE — BHH Group Notes (Signed)
B[facility] Group Notes:  (Nursing/MHT/Case Management/Adjunct)  Date:  02/05/2021  Time:  10:14 AM  Type of Therapy:  Group Therapy  Participation Level:  Did Not Attend  Summary of Progress/Problems:  Patient did not attend goals group.   Kenneth Yang 02/05/2021, 10:14 AM

## 2021-02-05 NOTE — BHH Group Notes (Signed)
Patient did not attend the relaxation group. 

## 2021-02-05 NOTE — Plan of Care (Signed)
Nurse discussed anxiety, depression and coping skills with patient.  

## 2021-02-06 ENCOUNTER — Encounter (HOSPITAL_COMMUNITY): Payer: Self-pay

## 2021-02-06 DIAGNOSIS — F19951 Other psychoactive substance use, unspecified with psychoactive substance-induced psychotic disorder with hallucinations: Secondary | ICD-10-CM | POA: Diagnosis not present

## 2021-02-06 MED ORDER — ADULT MULTIVITAMIN W/MINERALS CH
1.0000 | ORAL_TABLET | Freq: Every day | ORAL | Status: DC
  Administered 2021-02-06 – 2021-02-07 (×2): 1 via ORAL
  Filled 2021-02-06 (×5): qty 1

## 2021-02-06 NOTE — Progress Notes (Signed)
Adult Psychoeducational Group Note  Date:  02/06/2021 Time:  8:42 PM  Group Topic/Focus:  Wrap-Up Group:   The focus of this group is to help patients review their daily goal of treatment and discuss progress on daily workbooks.  Participation Level:  Did Not Attend  Participation Quality:   Not Applicable  Affect:   Not Applicable  Cognitive:   Not Applicable  Insight: None  Engagement in Group:   Not Applicable  Modes of Intervention:   Not Applicable  Additional Comments:    Nicoletta Dress 02/06/2021, 8:42 PM

## 2021-02-06 NOTE — BH IP Treatment Plan (Signed)
Interdisciplinary Treatment and Diagnostic Plan Update  02/06/2021 Time of Session: 10:30am Lleyton Byers MRN: [id number]  Principal Diagnosis: Substance-induced psychotic disorder with hallucinations ([place])  Secondary Diagnoses: Principal Problem:   Substance-induced psychotic disorder with hallucinations ([place]) Active Problems:   Auditory hallucinations   Cocaine abuse ([place])   MDD (major depressive disorder), recurrent, severe, with psychosis ([place])   Marijuana abuse   Cocaine use disorder, severe, dependence ([place])   Current Medications:  Current Facility-Administered Medications  Medication Dose Route Frequency Provider Last Rate Last Admin   acetaminophen (TYLENOL) tablet 650 mg  650 mg Oral Q6H PRN Chalmers Guest, NP       alum & mag hydroxide-simeth (MAALOX/MYLANTA) 200-200-20 MG/5ML suspension 30 mL  30 mL Oral Q4H PRN Chalmers Guest, NP       ARIPiprazole (ABILIFY) tablet 5 mg  5 mg Oral Daily Nwoko, Herbert Pun I, NP   5 mg at 02/06/21 0737   FLUoxetine (PROZAC) capsule 20 mg  20 mg Oral Daily Lindell Spar I, NP   20 mg at 02/06/21 1062   hydrOXYzine (ATARAX/VISTARIL) tablet 25 mg  25 mg Oral TID PRN Chalmers Guest, NP   25 mg at 02/05/21 0843   magnesium hydroxide (MILK OF MAGNESIA) suspension 30 mL  30 mL Oral Daily PRN Chalmers Guest, NP       pantoprazole (PROTONIX) EC tablet 40 mg  40 mg Oral Daily Lindell Spar I, NP   40 mg at 02/06/21 6948   traZODone (DESYREL) tablet 50 mg  50 mg Oral QHS PRN Chalmers Guest, NP       PTA Medications: Medications Prior to Admission  Medication Sig Dispense Refill Last Dose   amoxicillin (AMOXIL) 500 MG capsule Take 1 capsule (500 mg total) by mouth every 12 (twelve) hours. (Patient not taking: No sig reported) 8 capsule 0     Patient Stressors: Financial difficulties Loss of property Substance abuse Traumatic event  Patient Strengths: Agricultural engineer for treatment/growth  Treatment Modalities: Medication  Management, Group therapy, Case management,  1 to 1 session with clinician, Psychoeducation, Recreational therapy.   Physician Treatment Plan for Primary Diagnosis: Substance-induced psychotic disorder with hallucinations ([place]) Long Term Goal(s): Improvement in symptoms so as ready for discharge   Short Term Goals: Ability to identify changes in lifestyle to reduce recurrence of condition will improve Ability to verbalize feelings will improve Ability to disclose and discuss suicidal ideas Ability to demonstrate self-control will improve  Medication Management: Evaluate patient's response, side effects, and tolerance of medication regimen.  Therapeutic Interventions: 1 to 1 sessions, Unit Group sessions and Medication administration.  Evaluation of Outcomes: Not Met  Physician Treatment Plan for Secondary Diagnosis: Principal Problem:   Substance-induced psychotic disorder with hallucinations ([place]) Active Problems:   Auditory hallucinations   Cocaine abuse ([place])   MDD (major depressive disorder), recurrent, severe, with psychosis ([place])   Marijuana abuse   Cocaine use disorder, severe, dependence ([place])  Long Term Goal(s): Improvement in symptoms so as ready for discharge   Short Term Goals: Ability to identify changes in lifestyle to reduce recurrence of condition will improve Ability to verbalize feelings will improve Ability to disclose and discuss suicidal ideas Ability to demonstrate self-control will improve     Medication Management: Evaluate patient's response, side effects, and tolerance of medication regimen.  Therapeutic Interventions: 1 to 1 sessions, Unit Group sessions and Medication administration.  Evaluation of Outcomes: Not Met   RN Treatment Plan for Primary Diagnosis:  Substance-induced psychotic disorder with hallucinations ([place]) Long Term Goal(s): Knowledge of disease and therapeutic regimen to maintain health will improve  Short Term Goals: Ability to  demonstrate self-control, Ability to participate in decision making will improve, Ability to verbalize feelings will improve, Ability to identify and develop effective coping behaviors will improve, and Compliance with prescribed medications will improve  Medication Management: RN will administer medications as ordered by provider, will assess and evaluate patient's response and provide education to patient for prescribed medication. RN will report any adverse and/or side effects to prescribing provider.  Therapeutic Interventions: 1 on 1 counseling sessions, Psychoeducation, Medication administration, Evaluate responses to treatment, Monitor vital signs and CBGs as ordered, Perform/monitor CIWA, COWS, AIMS and Fall Risk screenings as ordered, Perform wound care treatments as ordered.  Evaluation of Outcomes: Not Met   LCSW Treatment Plan for Primary Diagnosis: Substance-induced psychotic disorder with hallucinations ([place]) Long Term Goal(s): Safe transition to appropriate next level of care at discharge, Engage patient in therapeutic group addressing interpersonal concerns.  Short Term Goals: Engage patient in aftercare planning with referrals and resources, Increase social support, Increase ability to appropriately verbalize feelings, Facilitate patient progression through stages of change regarding substance use diagnoses and concerns, Identify triggers associated with mental health/substance abuse issues, and Increase skills for wellness and recovery  Therapeutic Interventions: Assess for all discharge needs, 1 to 1 time with Social worker, Explore available resources and support systems, Assess for adequacy in community support network, Educate family and significant other(s) on suicide prevention, Complete Psychosocial Assessment, Interpersonal group therapy.  Evaluation of Outcomes: Not Met   Progress in Treatment: Attending groups: No. Participating in groups: No. Taking medication as  prescribed: Yes. Toleration medication: Yes. Family/Significant other contact made: No, will contact:  pt declined consents Patient understands diagnosis: Yes. Discussing patient identified problems/goals with staff: Yes. Medical problems stabilized or resolved: Yes. Denies suicidal/homicidal ideation: Yes. Issues/concerns per patient self-inventory: No. Other: None  New problem(s) identified: No, Describe:  none  New Short Term/Long Term Goal(s): detox, medication management for mood stabilization; elimination of SI thoughts; development of comprehensive mental wellness/sobriety plan  Patient Goals:  "To be clean"  Discharge Plan or Barriers: Patient is interested in residential substance use treatment at this time.   Reason for Continuation of Hospitalization: Depression Medication stabilization Suicidal ideation Withdrawal symptoms  Estimated Length of Stay: 3-5 days   Scribe for Treatment Team: Vassie Moselle, LCSW 02/06/2021 11:23 AM

## 2021-02-06 NOTE — Progress Notes (Signed)
Psychoeducational Group Note  Date:  02/06/2021 Time:  0915  Group Topic/Focus:  Goals Group:   The focus of this group is to help patients establish daily goals to achieve during treatment and discuss how the patient can incorporate goal setting into their daily lives to aide in recovery.  Participation Level: Did Not Attend  Participation Quality:  Not Applicable  Affect:  Not Applicable  Cognitive:  Not Applicable  Insight:  Not Applicable  Engagement in Group: Not Applicable  Additional Comments:  Pt did not attend the goals group this morning.  Tecia Cinnamon E 02/06/2021, 9:34 AM

## 2021-02-06 NOTE — Group Note (Signed)
Occupational Therapy Group Note  Group Topic:Feelings Management  Group Date: 02/06/2021 Start Time: 1400 End Time: 1450 Facilitators: Hunter Pinkard, OT   Group Description:  Group encouraged increased engagement and participation through discussion focused on Building Happiness. Patients were provided a handout and reviewed therapeutic strategies to build happiness including identifying gratitudes, random acts of kindness, exercise, meditation, positive journaling, and fostering relationships. Patients engaged in discussion and encouraged to reflect on each strategy and their experiences.  Therapeutic Goal(s): Identify strategies to build happiness. Identify and implement therapeutic strategies to improve overall mood. Practice and identify gratitudes, random acts of kindness, exercise, meditation, positive journaling, and fostering relationships  Participation Level: Patient did not attend OT group session despite personal invitation.    Plan: Continue to engage patient in OT groups 2 - 3x/week.  02/06/2021  Braylin Formby, OT 

## 2021-02-06 NOTE — Plan of Care (Signed)
Nurse discussed anxiety, depression and coping skills with patient.  

## 2021-02-06 NOTE — Progress Notes (Signed)
Psychoeducational Group Note  Date:  02/06/2021 Time:  1704  Group Topic/Focus:  Developing a Wellness Toolbox:   The focus of this group is to help patients develop a "wellness toolbox" with skills and strategies to promote recovery upon discharge.  Participation Level: Did Not Attend  Participation Quality:  Not Applicable  Affect:  Not Applicable  Cognitive:  Not Applicable  Insight:  Not Applicable  Engagement in Group: Not Applicable  Additional Comments:  Pt refused to attend the Psycho-Ed group this afternoon.  Caitlyn Buchanan E 02/06/2021, 5:20 PM

## 2021-02-06 NOTE — Plan of Care (Signed)
Patient has been isolative in room, guarded, irritable and angry. Has stayed in bed and refused to participate in group activities. Asked to be left alone. Patient did not ask for any medications : no HS scheduled medications. Writer visiting pt in room frequently but pt continues to be guarded and irritable "I am tired, leave me alone". Was encouraged to call staff as needed. Safety precautions reinforced.

## 2021-02-06 NOTE — Plan of Care (Signed)
Newly admitted 

## 2021-02-06 NOTE — Progress Notes (Signed)
D:  Patient denied SI and HI, contracts for safety.  Denied A/V hallucinations.  Denied pain. A:  Patient refused morning medications and  did not want to get out of bed or talk to nurse this morning.  Stated he would take meds later. R:  Safety maintained with 15 minute checks.
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
## 2021-02-06 NOTE — Progress Notes (Signed)
Recreation Therapy Notes  Date: 8.29.22 Time: 0930 Location: [address] Dayroom  Group Topic: Stress Management   Goal Area(s) Addresses:  Patient will actively participate in stress management techniques presented during session.  Patient will successfully identify benefit of practicing stress management post d/c.   Intervention: Relaxation exercise with ambient sound and script   Activity: Guided Imagery. LRT provided education, instruction, and demonstration on practice of visualization via guided imagery. Patient was asked to participate in the technique introduced during session. Patients were given suggestions of ways to access scripts post d/c and encouraged to explore Youtube and other apps available on smartphones, tablets, and computers.  Education:  Stress Management, Discharge Planning.   Education Outcome: Acknowledges education  Clinical Observations/Feedback: Patient did not attend group session.   Caroll Rancher, LRT/CTRS         Caroll Rancher A 02/06/2021 10:55 AM

## 2021-02-06 NOTE — BHH Group Notes (Signed)
Patient did not attend group.    Spiritual care group on grief and loss facilitated by chaplain Katy Kassidy Dockendorf, BCC   Group Goal:   Support / Education around grief and loss   Members engage in facilitated group support and psycho-social education.   Group Description:   Following introductions and group rules, group members engaged in facilitated group dialog and support around topic of loss, with particular support around experiences of loss in their lives. Group Identified types of loss (relationships / self / things) and identified patterns, circumstances, and changes that precipitate losses. Reflected on thoughts / feelings around loss, normalized grief responses, and recognized variety in grief experience. Group noted Worden's four tasks of grief in discussion.   Group drew on Adlerian / Rogerian, narrative, MI,    

## 2021-02-06 NOTE — BHH Group Notes (Signed)
CSW was unable to hold group today due to late discharges and acuity of the unit.   Nathin Saran, LCSWA Clinicial Social Worker [facility] 

## 2021-02-07 DIAGNOSIS — F19951 Other psychoactive substance use, unspecified with psychoactive substance-induced psychotic disorder with hallucinations: Secondary | ICD-10-CM | POA: Diagnosis not present

## 2021-02-07 MED ORDER — ENSURE ENLIVE PO LIQD
237.0000 mL | Freq: Two times a day (BID) | ORAL | Status: DC
  Filled 2021-02-07 (×6): qty 237

## 2021-02-07 MED ORDER — FLUOXETINE HCL 20 MG PO CAPS: 20.0000 mg | ORAL_CAPSULE | Freq: Every day | ORAL | 0 refills | Status: DC

## 2021-02-07 MED ORDER — ARIPIPRAZOLE 5 MG PO TABS: 5.0000 mg | ORAL_TABLET | Freq: Every day | ORAL | 0 refills | Status: DC

## 2021-02-07 NOTE — Progress Notes (Signed)
The focus of this group is to help patients review their daily goal of treatment and discuss progress on daily workbooks. Pt did not attend the evening group. 

## 2021-02-07 NOTE — BHH Counselor (Signed)
CSW again discussed [facility] for this pt and explain their program. CSW has explained this program to pt a total of 5 times at this time.   Fredirick Lathe, LCSWA Clinicial Social Worker [employer]

## 2021-02-07 NOTE — Progress Notes (Signed)
  [facility] Adult Case Management Discharge Plan :  Will you be returning to the same living situation after discharge:  No. [facility] At discharge, do you have transportation home?: No. Safe Transport Do you have the ability to pay for your medications: No.  Release of information consent forms completed and in the chart;  Patient's signature needed at discharge.  Patient to Follow up at:  Follow-up Information     Services, [facility] Follow up.   Why: You have been accepted to this facility for substance use treatment on Wednesday at [facility] information: [address] [place] [address] [phone]         [facility]. Call.   Why: Please call to schedule an appointment for monitoring of your A1c and glucose level. Contact information: [address] [place] [address] [phone]                Next level of care provider has access to [facility] Link:yes  Safety Planning and Suicide Prevention discussed: Yes,  w/ pt     Has patient been referred to the Quitline?: Patient refused referral  Patient has been referred for addiction treatment: Yes  Felizardo Hoffmann, LCSWA 02/07/2021, 3:29 PM

## 2021-02-07 NOTE — Progress Notes (Addendum)
Pt refused evening lab draw. Pt was encouraged but said angrily, "I don't want my blood drawn! I already know what's wrong with me and the doctor already told me. Maybe later." Pt was informed that there was only one person that does the labs that comes from across the street and they are here at this time, will not be here later. Pt replies, "Then forget it. I said no. Now leave me alone I said!"   Pt has been isolating in his room, is A&Ox4, denies SI/HI/AVH, is angry, irritable, uncooperative, dismissive, isolates in his room, refused dinner.

## 2021-02-07 NOTE — Plan of Care (Signed)
Remains isolative in room with no eye contact, rude, angry and irritable. Refusing services.

## 2021-02-07 NOTE — Progress Notes (Signed)
Patient slept throughout the night. Refused to get up for vital signs. Refused AM medication (Protonix). Patient is guarded and irritable. Currently refusing to go to the cafeteria for breakfast.
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
## 2021-02-07 NOTE — Progress Notes (Signed)
NUTRITION ASSESSMENT  Pt identified as at risk on the Malnutrition Screen Tool  INTERVENTION: 1. Supplements: Ensure Plus PO BID, each provides 350 kcals and 13g protein  NUTRITION DIAGNOSIS: Unintentional weight loss related to sub-optimal intake as evidenced by pt report.   Goal: Pt to meet >/= 90% of their estimated nutrition needs.  Monitor:  PO intake  Assessment:  Pt admitted for depression and substance abuse (cocaine, THC, meth). Pt currently homeless. Per weight records, no weight loss. Will add Ensure supplements.   Height: Ht Readings from Last 1 Encounters:  02/03/21 5\' 8"  (1.727 m)    Weight: Wt Readings from Last 1 Encounters:  02/03/21 72.6 kg    Weight Hx: Wt Readings from Last 10 Encounters:  02/03/21 72.6 kg  02/02/21 72.6 kg  07/16/20 73.5 kg  08/25/19 81.6 kg    BMI:  Body mass index is 24.33 kg/m. Pt meets criteria for normal based on current BMI.  Estimated Nutritional Needs: Kcal: 25-30 kcal/kg Protein: > 1 gram protein/kg Fluid: 1 ml/kcal  Diet Order:  Diet Order             Diet regular Room service appropriate? Yes; Fluid consistency: Thin  Diet effective now                  Pt is also offered choice of unit snacks mid-morning and mid-afternoon.  Pt is eating as desired.   Lab results and medications reviewed.   08/27/19, MS, RD, LDN Inpatient Clinical Dietitian Contact information available via Amion
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
## 2021-02-07 NOTE — [facility] Suicide Risk Assessment (Signed)
[facility] Discharge Suicide Risk Assessment   Principal Problem: Substance-induced psychotic disorder with hallucinations (HCC) Discharge Diagnoses: Principal Problem:   Substance-induced psychotic disorder with hallucinations (HCC) Active Problems:   Auditory hallucinations   Cocaine abuse (HCC)   MDD (major depressive disorder), recurrent, severe, with psychosis (HCC)   Marijuana abuse   Cocaine use disorder, severe, dependence (HCC)  Subjective: Patient seen on rounds and denies SI, HI, AVH, paranoia or delusions. He voices no signs of withdrawal and voices no physical complaints. He expresses desire to go to [facility] tomorrow, and abstinence from illicit substances was encouraged. He was advised he will need ongoing metabolic labs, weight, CBC, AIMS and EKG monitoring while on an atypical antipsychotic. We discussed that his Ca+ is slightly low, his HbgA1c is in pre-diabetic range, that his LFTs and bilirubin are elevated, and his sodium is slightly low. He refused any repeat lab testing at this time and was encouraged to see a PCP without fail for recheck of these labs in the next 7-10 days. Time was given for questions.  Total Time Spent in Direct Patient Care:  I personally spent 30 minutes on the unit in direct patient care. The direct patient care time included face-to-face time with the patient, reviewing the patient's chart, communicating with other professionals, and coordinating care. Greater than 50% of this time was spent in counseling or coordinating care with the patient regarding goals of hospitalization, psycho-education, and discharge planning needs.  Musculoskeletal: Strength & Muscle Tone: within normal limits Gait & Station:  untested in bed Patient leans: N/A  Psychiatric Specialty Exam: Physical Exam Vitals reviewed.  HENT:     Head: Normocephalic.  Pulmonary:     Effort: Pulmonary effort is normal.  Neurological:     General: No focal deficit present.      Mental Status: He is alert.    Review of Systems  Respiratory:  Negative for shortness of breath.   Cardiovascular:  Negative for chest pain.  Gastrointestinal:  Negative for diarrhea, nausea and vomiting.   Blood pressure 119/90, pulse 76, temperature 97.8 F (36.6 C), temperature source Oral, resp. rate 18, height 5\' 8"  (1.727 m), weight 72.6 kg, SpO2 100 %.Body mass index is 24.33 kg/m.  General Appearance:  casually dressed, adequate hygiene  Eye Contact:  Fair  Speech:  Clear and Coherent and Normal Rate  Volume:  Normal  Mood:  Euthymic  Affect:   moderate, stable, full  Thought Process:  Goal Directed and Linear  Orientation:  Full (Time, Place, and Person)  Thought Content:  Logical and no evidence of psychosis, paranoia, or delusions  Suicidal Thoughts:  No  Homicidal Thoughts:  No  Memory:  Recent;   Good  Judgement:  Fair  Insight:  Fair  Psychomotor Activity:  Normal, no cogwheeling, no stiffness, no tremor; AIMS 0  Concentration:  Concentration: Good and Attention Span: Good  Recall:  Good  Fund of Knowledge:  Good  Language:  Good  Akathisia:  Negative  Assets:  Communication Skills Desire for Improvement Resilience  ADL's:  Intact  Cognition:  WNL  Sleep:  Number of Hours: 5   Mental Status Per Nursing Assessment::   On Admission:  substance use; SI; AH  Demographic Factors:  Male, Low socioeconomic status, and Unemployed  Loss Factors: Loss of significant relationship and Financial problems/change in socioeconomic status  Historical Factors: Impulsivity; substance abuse prior to admission; prior suicide attempt   Risk Reduction Factors:   Positive coping skills or problem  solving skills  Continued Clinical Symptoms:  Depression:   Impulsivity Alcohol/Substance Abuse/Dependencies  Cognitive Features That Contribute To Risk:  Thought constriction (tunnel vision)    Suicide Risk:  Mild: No identifiable suicidal ideation, intent or plan; some  risk factors including previous reported attempt, depression dx, and substance use prior to admission.   Follow-up Information     Services, [facility] Follow up.   Why: You have been accepted to this facility for substance use treatment on Wednesday at [facility] information: [address] [place] [address] [phone]         [facility]. Call.   Why: Please call to schedule an appointment for monitoring of your A1c and glucose level. Contact information: [address] [place] [address] [phone]                Plan Of Care/Follow-up recommendations:  Activity:  as tolerated Diet:  heart healthy Other:  Patient was advised to go directly to [facility] treatment and to abstain from illicit substances/alcohol after discharge. He was advised to see the [facility] or the Health Department in the next 7-10 days without fail for recheck of his elevated Hemoglobin A1c (blood glucose levels), low calcium, low sodium, and elevated liver panel/elevated bilirubin without fail. He is advised to avoid use of Tylenol given his elevated liver panel. He was reminded he will need ongoing monitoring of his EKG, AIMS, weight, CBC, glucose, and lipids while on an atypical antipsychotic.   Comer Locket, MD, FAPA 02/07/2021, 4:22 PM

## 2021-02-07 NOTE — Progress Notes (Signed)
Recreation Therapy Notes  Animal-Assisted Activity (AAA) Program Checklist/Progress Notes Patient Eligibility Criteria Checklist & Daily Group note for Rec Tx Intervention  Date: 8.30.22 Time: 1430 Location: [address]  AAA/T Program Assumption of Risk Form signed by Engineer, production or Parent Legal Guardian  YES   Patient is free of allergies or severe asthma  YES   Patient reports no fear of animals YES  Patient reports no history of cruelty to animals YES  Patient understands his/her participation is voluntary YES   Patient washes hands before animal contact YES   Patient washes hands after animal contact YES   Education: Charity fundraiser, Appropriate Animal Interaction   Education Outcome: Acknowledges understanding/In group clarification offered/Needs additional education.   Clinical Observations/Feedback: Pt did not attend group session.     Caroll Rancher, LRT/CTRS        Caroll Rancher A 02/07/2021 3:57 PM

## 2021-02-08 MED ORDER — FLUOXETINE HCL 20 MG PO CAPS: 20.0000 mg | ORAL_CAPSULE | Freq: Every day | ORAL | 0 refills | Status: DC

## 2021-02-08 MED ORDER — ARIPIPRAZOLE 5 MG PO TABS: 5.0000 mg | ORAL_TABLET | Freq: Every day | ORAL | 0 refills | Status: DC

## 2021-02-08 NOTE — Progress Notes (Signed)
Discharge Note:   Pt discharged at 08:30am, left in a taxi with voucher arranged by SW, to go to [facility]. Pt is A&Ox4, denies SI/HI/AVH. Pt given discharge instructions, with follow up appointments, sample prescription medication provided by pharmacy to pt. Pt also given written prescriptions, discharge medication education; pt verbalized understanding of all. All personal belongings returned to pt upon discharge.

## 2021-02-09 NOTE — Progress Notes (Signed)
Was informed by Belenda Cruise from [facility] who called at 1:15 pm on 02/09/21 to inform that patient never showed up for admission to their facility.  He is not on the census there.

## 2021-06-17 ENCOUNTER — Emergency Department
Admission: EM | Admit: 2021-06-17 | Discharge: 2021-06-17 | Disposition: A | Discharge status: Home / Self Care | Payer: Federal, State, Local not specified - Other | Attending: Emergency Medicine | Admitting: Emergency Medicine

## 2021-06-17 ENCOUNTER — Other Ambulatory Visit: Payer: Self-pay

## 2021-06-17 ENCOUNTER — Encounter: Payer: Self-pay | Admitting: Intensive Care

## 2021-06-17 DIAGNOSIS — F14951 Cocaine use, unspecified with cocaine-induced psychotic disorder with hallucinations: Secondary | ICD-10-CM | POA: Diagnosis not present

## 2021-06-17 DIAGNOSIS — F1410 Cocaine abuse, uncomplicated: Secondary | ICD-10-CM | POA: Diagnosis present

## 2021-06-17 DIAGNOSIS — Z79899 Other long term (current) drug therapy: Secondary | ICD-10-CM | POA: Insufficient documentation

## 2021-06-17 DIAGNOSIS — F1420 Cocaine dependence, uncomplicated: Secondary | ICD-10-CM | POA: Diagnosis present

## 2021-06-17 DIAGNOSIS — Y900 Blood alcohol level of less than 20 mg/100 ml: Secondary | ICD-10-CM | POA: Insufficient documentation

## 2021-06-17 LAB — COMPREHENSIVE METABOLIC PANEL
ALT: 34 U/L (ref 0–44)
AST: 84 U/L — ABNORMAL HIGH (ref 15–41)
Albumin: 4.8 g/dL (ref 3.5–5.0)
Alkaline Phosphatase: 81 U/L (ref 38–126)
Anion gap: 8 (ref 5–15)
BUN: 17 mg/dL (ref 6–20)
CO2: 26 mmol/L (ref 22–32)
Calcium: 9.4 mg/dL (ref 8.9–10.3)
Chloride: 100 mmol/L (ref 98–111)
Creatinine, Ser: 1.09 mg/dL (ref 0.61–1.24)
GFR, Estimated: >60 mL/min (ref >60)
Glucose, Bld: 122 mg/dL — ABNORMAL HIGH (ref 70–99)
Potassium: 3.7 mmol/L (ref 3.5–5.1)
Sodium: 134 mmol/L — ABNORMAL LOW (ref 135–145)
Total Bilirubin: 1.6 mg/dL — ABNORMAL HIGH (ref 0.3–1.2)
Total Protein: 8.3 g/dL — ABNORMAL HIGH (ref 6.5–8.1)

## 2021-06-17 LAB — CBC
HCT: 47.8 % (ref 39.0–52.0)
Hemoglobin: 16.1 g/dL (ref 13.0–17.0)
MCH: 27.7 pg (ref 26.0–34.0)
MCHC: 33.7 g/dL (ref 30.0–36.0)
MCV: 82.3 fL (ref 80.0–100.0)
Platelets: 207 10*3/uL (ref 150–400)
RBC: 5.81 MIL/uL (ref 4.22–5.81)
RDW: 13.8 % (ref 11.5–15.5)
WBC: 5.4 10*3/uL (ref 4.0–10.5)
nRBC: 0 % (ref 0.0–0.2)

## 2021-06-17 LAB — SALICYLATE LEVEL: Salicylate Lvl: <7 mg/dL — ABNORMAL LOW (ref 7.0–30.0)

## 2021-06-17 LAB — ACETAMINOPHEN LEVEL: Acetaminophen (Tylenol), Serum: <10 ug/mL — ABNORMAL LOW (ref 10–30)

## 2021-06-17 LAB — ETHANOL: Alcohol, Ethyl (B): <10 mg/dL (ref <10)

## 2021-06-17 MED ORDER — LORAZEPAM 2 MG PO TABS: 2.0000 mg | ORAL_TABLET | Freq: Once | ORAL | Status: DC

## 2021-06-17 MED ORDER — FLUOXETINE HCL 20 MG PO CAPS: 20.0000 mg | ORAL_CAPSULE | Freq: Every day | ORAL | Status: DC

## 2021-06-17 MED ORDER — CYCLOBENZAPRINE HCL 10 MG PO TABS
10.0000 mg | ORAL_TABLET | Freq: Once | ORAL | Status: AC
  Administered 2021-06-17: 10 mg via ORAL
  Filled 2021-06-17: qty 1

## 2021-06-17 MED ORDER — ARIPIPRAZOLE 5 MG PO TABS
5.0000 mg | ORAL_TABLET | Freq: Every day | ORAL | Status: DC
  Filled 2021-06-17: qty 1

## 2021-06-17 MED ORDER — BENZTROPINE MESYLATE 1 MG PO TABS: 1.0000 mg | ORAL_TABLET | Freq: Every day | ORAL | Status: DC

## 2021-06-17 MED ORDER — HALOPERIDOL 5 MG PO TABS: 5.0000 mg | ORAL_TABLET | Freq: Two times a day (BID) | ORAL | Status: DC

## 2021-06-17 MED ORDER — GABAPENTIN 300 MG PO CAPS
300.0000 mg | ORAL_CAPSULE | Freq: Three times a day (TID) | ORAL | Status: DC
  Administered 2021-06-17: 300 mg via ORAL
  Filled 2021-06-17: qty 1

## 2021-06-17 NOTE — ED Triage Notes (Addendum)
Patient reports he has been on a cocaine and alcohol binge. Patient reports last using cocaine this AM. Patient is having visual and auditory hallucinations and reports wanting to hurt himself. Plan would be drugs but has not acted on it.

## 2021-06-17 NOTE — ED Triage Notes (Signed)
Pt in via EMS from home with c/o feeling paranoid and not able to sleep. Pt thinks others are trying to kill him. 180/116, FSBS 168, 98% RA

## 2021-06-17 NOTE — ED Notes (Signed)
Lunch tray given. 

## 2021-06-17 NOTE — Discharge Instructions (Signed)
Follow up with [facility]. °

## 2021-06-17 NOTE — ED Notes (Signed)
Khaki pants, black t shirt, grey stocking, white socks, shorts, black tennis shoes, 1 cigarette, two lighters, black cell phone.

## 2021-06-17 NOTE — ED Provider Notes (Signed)
[facility] Provider Note    Event Date/Time   First MD Initiated Contact with Patient 06/17/21 0945     (approximate)   History   Mental Health Problem   HPI  Kenneth Yang is a 56 y.o. male with past medical history as below here with mental health problem.  Patient admits that he has been using significant amounts of cocaine, alcohol, and nicotine over the last several days.  The patient has "fallen off" and has been using significantly more than usual.  He has been on the streets.  He states that he has had subsequent increasing depression and thoughts about wanting to harm self.  He states he tries and has tried to use enough drugs to kill himself without success.  He states he has also been hearing things and seeing things that he knows are not there.  He has a history of previous IVC and psychiatric admission.  He feels anxious, and currently is somewhat agitated because he does not know where his car is.  He is requesting to leave to figure this out, but admits to ongoing thoughts of wanting to harm himself.  Remainder of history limited due to so intoxication and agitation.     Physical Exam   Triage Vital Signs: ED Triage Vitals  Enc Vitals Group     BP 06/17/21 0944 108/81     Pulse Rate 06/17/21 0944 93     Resp 06/17/21 0944 16     Temp 06/17/21 0944 98 F (36.7 C)     Temp Source 06/17/21 0944 Oral     SpO2 06/17/21 0944 94 %     Weight 06/17/21 0934 160 lb (72.6 kg)     Height 06/17/21 0934 5\' 8"  (1.727 m)     Head Circumference --      Peak Flow --      Pain Score 06/17/21 0934 10     Pain Loc --      Pain Edu? --      Excl. in GC? --     Most recent vital signs: Vitals:   06/17/21 0944  BP: 108/81  Pulse: 93  Resp: 16  Temp: 98 F (36.7 C)  SpO2: 94%     General: Awake, no distress.  CV:  Good peripheral perfusion.  Resp:  Normal effort.  Abd:  No distention.  Other:  Mood slightly agitated, speech slightly  pressured.   ED Results / Procedures / Treatments   Labs (all labs ordered are listed, but only abnormal results are displayed) Labs Reviewed  COMPREHENSIVE METABOLIC PANEL - Abnormal; Notable for the following components:      Result Value   Sodium 134 (*)    Glucose, Bld 122 (*)    Total Protein 8.3 (*)    AST 84 (*)    Total Bilirubin 1.6 (*)    All other components within normal limits  SALICYLATE LEVEL - Abnormal; Notable for the following components:   Salicylate Lvl <7.0 (*)    All other components within normal limits  ACETAMINOPHEN LEVEL - Abnormal; Notable for the following components:   Acetaminophen (Tylenol), Serum <10 (*)    All other components within normal limits  RESP PANEL BY RT-PCR (FLU A&B, COVID) ARPGX2  ETHANOL  CBC  URINE DRUG SCREEN, QUALITATIVE ([facility] ONLY)     EKG     RADIOLOGY     PROCEDURES:  Critical Care performed: No   MEDICATIONS ORDERED IN ED: Medications  LORazepam (ATIVAN) tablet 2 mg (2 mg Oral Patient Refused/Not Given 06/17/21 1017)  haloperidol (HALDOL) tablet 5 mg (5 mg Oral Not Given 06/17/21 1526)  benztropine (COGENTIN) tablet 1 mg (1 mg Oral Patient Refused/Not Given 06/17/21 1526)  gabapentin (NEURONTIN) capsule 300 mg (300 mg Oral Not Given 06/17/21 1526)  FLUoxetine (PROZAC) capsule 20 mg (20 mg Oral Patient Refused/Not Given 06/17/21 1527)  cyclobenzaprine (FLEXERIL) tablet 10 mg (10 mg Oral Given 06/17/21 1505)     IMPRESSION / MDM / ASSESSMENT AND PLAN / ED COURSE  I reviewed the triage vital signs and the nursing notes.                               Ddx: Substance-induced mood d/o, MDD, bipolar   Plan: Psychiatry consult and disposition.   MEDICATIONS GIVEN IN ED: Medications  LORazepam (ATIVAN) tablet 2 mg (2 mg Oral Patient Refused/Not Given 06/17/21 1017)  haloperidol (HALDOL) tablet 5 mg (5 mg Oral Not Given 06/17/21 1526)  benztropine (COGENTIN) tablet 1 mg (1 mg Oral Patient Refused/Not Given 06/17/21 1526)   gabapentin (NEURONTIN) capsule 300 mg (300 mg Oral Not Given 06/17/21 1526)  FLUoxetine (PROZAC) capsule 20 mg (20 mg Oral Patient Refused/Not Given 06/17/21 1527)  cyclobenzaprine (FLEXERIL) tablet 10 mg (10 mg Oral Given 06/17/21 1505)     ED COURSE:  Pt presents with substance use and depression. Pt appears intoxicated on arrival, likely from stimulant/cocaine. Given ativan with improvement. He did endorse +SI, with plan, so IVC placed and will c/s Psych though suspect much of this was substance related. H/o similar presentations per review of pysch admissions and prior ED visits. Labs reassuring - no leukocytosis. Lytes wnl. Tox labs negative. Mild AST elevation likely from EtOH use.      Consults: Psychiatry consulted and case discussed with NP, will await recommendation. Social work consulted for assistance with disposition.   EMR reviewed As above, reviewed prior ED visits and psych admission 01/2021.     FINAL CLINICAL IMPRESSION(S) / ED DIAGNOSES   Final diagnoses:  Cocaine-induced psychotic disorder with hallucinations [facility])     Rx / DC Orders   ED Discharge Orders          Ordered    Increase activity slowly        06/17/21 1523    Diet - low sodium heart healthy        06/17/21 1523    Discharge instructions       Comments: Follow up with [facility]   06/17/21 1523             Note:  This document was prepared using Dragon voice recognition software and may include unintentional dictation errors.   Shaune Pollack, MD 06/17/21 1730

## 2021-06-17 NOTE — Consult Note (Signed)
[place] Psychiatry Consult   Reason for Consult:  Cocaine abuse with hallucinations Referring Physician:  EDP Patient Identification: Kenneth Yang MRN:  [id number] Principal Diagnosis: Cocaine-induced psychotic disorder with hallucinations ([place]) Diagnosis:  Principal Problem:   Cocaine-induced psychotic disorder with hallucinations ([place]) Active Problems:   Cocaine use disorder, severe, dependence ([place])   Total Time spent with patient: 45 minutes  Subjective:   Kenneth Yang is a 56 y.o. male patient admitted with cocaine abuse with psychosis.  HPI:  56 yo male presented with "cocaine and alcohol binge" and having hallucinations with passive suicidal ideations.  He was given Ativan and ate lunch.  On assessment, he denies suicidal/homicidal ideations, hallucinations, and withdrawal symptoms.  Not responding to internal stimuli on assessment, calm and cooperative, jovial with the tech sitting nearby.  BAL was negative, no UDS provided.  Recommended detox/rehab and he declined.  Explained the other options of inpatient and/or outpatient services and he declined.  Asked he felt safe to discharge, "Yes, just release me."  Encouraged [facility] and IOP for SA and/or mental health care, client declined.  Past Psychiatric History: cocaine use d/o, depression, anxiety  Risk to Self:  none Risk to Others:  none Prior Inpatient Therapy:  yes, [address], [place] and [facility] Prior Outpatient Therapy:  none currently  Past Medical History: History reviewed. No pertinent past medical history. History reviewed. No pertinent surgical history. Family History: History reviewed. No pertinent family history. Family Psychiatric  History: denies Social History:  Social History   Substance and Sexual Activity  Alcohol Use Yes   Comment: Pt stated anything he can get his hands on     Social History   Substance and Sexual Activity  Drug Use Yes   Types: Methamphetamines,  Marijuana, "Crack" cocaine, Cocaine    Social History   Socioeconomic History   Marital status: Significant Other    Spouse name: Not on file   Number of children: Not on file   Years of education: Not on file   Highest education level: Not on file  Occupational History   Not on file  Tobacco Use   Smoking status: Every Day    Types: E-cigarettes, Cigarettes   Smokeless tobacco: Never  Vaping Use   Vaping Use: Former  Substance and Sexual Activity   Alcohol use: Yes    Comment: Pt stated anything he can get his hands on   Drug use: Yes    Types: Methamphetamines, Marijuana, "Crack" cocaine, Cocaine   Sexual activity: Not on file  Other Topics Concern   Not on file  Social History Narrative   Not on file   Social Determinants of Health   Financial Resource Strain: Not on file  Food Insecurity: Not on file  Transportation Needs: Not on file  Physical Activity: Not on file  Stress: Not on file  Social Connections: Not on file   Additional Social History:    Allergies:   Allergies  Allergen Reactions   Risperidone Other (See Comments)    Tongue EPS, drooling    Labs:  Results for orders placed or performed during the hospital encounter of 06/17/21 (from the past 48 hour(s))  Comprehensive metabolic panel     Status: Abnormal   Collection Time: 06/17/21  9:46 AM  Result Value Ref Range   Sodium 134 (L) 135 - 145 mmol/L   Potassium 3.7 3.5 - 5.1 mmol/L   Chloride 100 98 - 111 mmol/L   CO2 26 22 -  32 mmol/L   Glucose, Bld 122 (H) 70 - 99 mg/dL    Comment: Glucose reference range applies only to samples taken after fasting for at least 8 hours.   BUN 17 6 - 20 mg/dL   Creatinine, Ser 1.09 0.61 - 1.24 mg/dL   Calcium 9.4 8.9 - 10.3 mg/dL   Total Protein 8.3 (H) 6.5 - 8.1 g/dL   Albumin 4.8 3.5 - 5.0 g/dL   AST 84 (H) 15 - 41 U/L   ALT 34 0 - 44 U/L   Alkaline Phosphatase 81 38 - 126 U/L   Total Bilirubin 1.6 (H) 0.3 - 1.2 mg/dL   GFR, Estimated >60 >60  mL/min    Comment: (NOTE) Calculated using the CKD-EPI Creatinine Equation (2021)    Anion gap 8 5 - 15    Comment: Performed at [facility], [address] [place], [place] [address]  Ethanol     Status: None   Collection Time: 06/17/21  9:46 AM  Result Value Ref Range   Alcohol, Ethyl (B) <10 <10 mg/dL    Comment: (NOTE) Lowest detectable limit for serum alcohol is 10 mg/dL.  For medical purposes only. Performed at [facility], [address] [place], [place] [id number]   Salicylate level     Status: Abnormal   Collection Time: 06/17/21  9:46 AM  Result Value Ref Range   Salicylate Lvl Q000111Q (L) 7.0 - 30.0 mg/dL    Comment: Performed at [facility], [address] [place], [place] [address]  Acetaminophen level     Status: Abnormal   Collection Time: 06/17/21  9:46 AM  Result Value Ref Range   Acetaminophen (Tylenol), Serum <10 (L) 10 - 30 ug/mL    Comment: (NOTE) Therapeutic concentrations vary significantly. A range of 10-30 ug/mL  may be an effective concentration for many patients. However, some  are best treated at concentrations outside of this range. Acetaminophen concentrations >150 ug/mL at 4 hours after ingestion  and >50 ug/mL at 12 hours after ingestion are often associated with  toxic reactions.  Performed at [facility], [address] [place], [place] [address]   cbc     Status: None   Collection Time: 06/17/21  9:46 AM  Result Value Ref Range   WBC 5.4 4.0 - 10.5 K/uL   RBC 5.81 4.22 - 5.81 MIL/uL   Hemoglobin 16.1 13.0 - 17.0 g/dL   HCT 47.8 39.0 - 52.0 %   MCV 82.3 80.0 - 100.0 fL   MCH 27.7 26.0 - 34.0 pg   MCHC 33.7 30.0 - 36.0 g/dL   RDW 13.8 11.5 - 15.5 %   Platelets 207 150 - 400 K/uL   nRBC 0.0 0.0 - 0.2 %    Comment: Performed at [facility], [address], [place],  [address]    Current Facility-Administered Medications  Medication Dose Route Frequency Provider Last Rate  Last Admin   ARIPiprazole (ABILIFY) tablet 5 mg  5 mg Oral Daily Duffy Bruce, MD       benztropine (COGENTIN) tablet 1 mg  1 mg Oral Daily Jalesa Thien Y, NP       FLUoxetine (PROZAC) capsule 20 mg  20 mg Oral Daily Duffy Bruce, MD       gabapentin (NEURONTIN) capsule 300 mg  300 mg Oral TID Patrecia Pour, NP   300 mg at 06/17/21 1505   haloperidol (HALDOL) tablet 5 mg  5 mg Oral BID Waylan Boga  Y, NP       LORazepam (ATIVAN) tablet 2 mg  2 mg Oral Once Duffy Bruce, MD       Current Outpatient Medications  Medication Sig Dispense Refill   ARIPiprazole (ABILIFY) 5 MG tablet Take 1 tablet (5 mg total) by mouth daily. (Patient not taking: Reported on 06/17/2021) 30 tablet 0   FLUoxetine (PROZAC) 20 MG capsule Take 1 capsule (20 mg total) by mouth daily. 30 capsule 0    Musculoskeletal: Strength & Muscle Tone: within normal limits Gait & Station: normal Patient leans: N/A  Psychiatric Specialty Exam: Physical Exam Vitals and nursing note reviewed.  Constitutional:      Appearance: Normal appearance.  HENT:     Head: Normocephalic.     Nose: Nose normal.  Pulmonary:     Effort: Pulmonary effort is normal.  Musculoskeletal:        General: Normal range of motion.     Cervical back: Normal range of motion.  Neurological:     General: No focal deficit present.     Mental Status: He is alert.  Psychiatric:        Attention and Perception: Attention and perception normal.        Mood and Affect: Mood is anxious.        Speech: Speech normal.        Behavior: Behavior normal. Behavior is cooperative.        Thought Content: Thought content normal.        Cognition and Memory: Cognition and memory normal.        Judgment: Judgment normal.    Review of Systems  Psychiatric/Behavioral:  Positive for substance abuse. The patient is nervous/anxious.   All other systems reviewed and are negative.  Blood pressure 108/81, pulse 93, temperature 98 F (36.7 C), temperature  source Oral, resp. rate 16, height 5\' 8"  (1.727 m), weight 72.6 kg, SpO2 94 %.Body mass index is 24.33 kg/m.  General Appearance: Casual  Eye Contact:  Good  Speech:  Normal Rate  Volume:  Normal  Mood:  Anxious  Affect:  Congruent  Thought Process:  Coherent and Descriptions of Associations: Intact  Orientation:  Full (Time, Place, and Person)  Thought Content:  WDL and Logical  Suicidal Thoughts:  No  Homicidal Thoughts:  No  Memory:  Immediate;   Good Recent;   Good Remote;   Good  Judgement:  Fair  Insight:  Fair  Psychomotor Activity:  Normal  Concentration:  Concentration: Fair and Attention Span: Fair  Recall:  [employer] of Knowledge:  Fair  Language:  Good  Akathisia:  No  Handed:  Right  AIMS (if indicated):     Assets:  Housing Leisure Time Physical Health Resilience Social Support  ADL's:  Intact  Cognition:  WNL  Sleep:        Physical Exam: Physical Exam Vitals and nursing note reviewed.  Constitutional:      Appearance: Normal appearance.  HENT:     Head: Normocephalic.     Nose: Nose normal.  Pulmonary:     Effort: Pulmonary effort is normal.  Musculoskeletal:        General: Normal range of motion.     Cervical back: Normal range of motion.  Neurological:     General: No focal deficit present.     Mental Status: He is alert.  Psychiatric:        Attention and Perception: Attention and perception normal.  Mood and Affect: Mood is anxious.        Speech: Speech normal.        Behavior: Behavior normal. Behavior is cooperative.        Thought Content: Thought content normal.        Cognition and Memory: Cognition and memory normal.        Judgment: Judgment normal.   Review of Systems  Psychiatric/Behavioral:  Positive for substance abuse. The patient is nervous/anxious.   All other systems reviewed and are negative. Blood pressure 108/81, pulse 93, temperature 98 F (36.7 C), temperature source Oral, resp. rate 16, height 5\' 8"   (1.727 m), weight 72.6 kg, SpO2 94 %. Body mass index is 24.33 kg/m.  Treatment Plan Summary: Cocaine induced psychosis with hallucinations: -Recommended detox/rehab, declined Declined substance abuse IOP at [facility]  Disposition: No evidence of imminent risk to self or others at present.    Waylan Boga, NP 06/17/2021 3:14 PM

## 2021-06-17 NOTE — ED Provider Notes (Signed)
The patient has been evaluated at bedside by Shaune Pollack, psychiatry.  Patient is clinically stable.  Not felt to be a danger to self or others.  No SI or Hi.  No indication for inpatient psychiatric admission at this time.  Appropriate for continued outpatient therapy.    Willy Eddy, MD 06/17/21 1536

## 2022-07-14 IMAGING — CR DG CHEST 2V
1 series · 2 of 2 positions shown · non-contrast
Comparison: None.

CLINICAL DATA: Chest pain and shortness of breath. Patient admits
to drinking and using drugs.

EXAM:
CHEST - 2 VIEW

[Series 1: dg chest 2 view · 0.14mm/px · 2 of 2 slices shown]
[im 1/2]
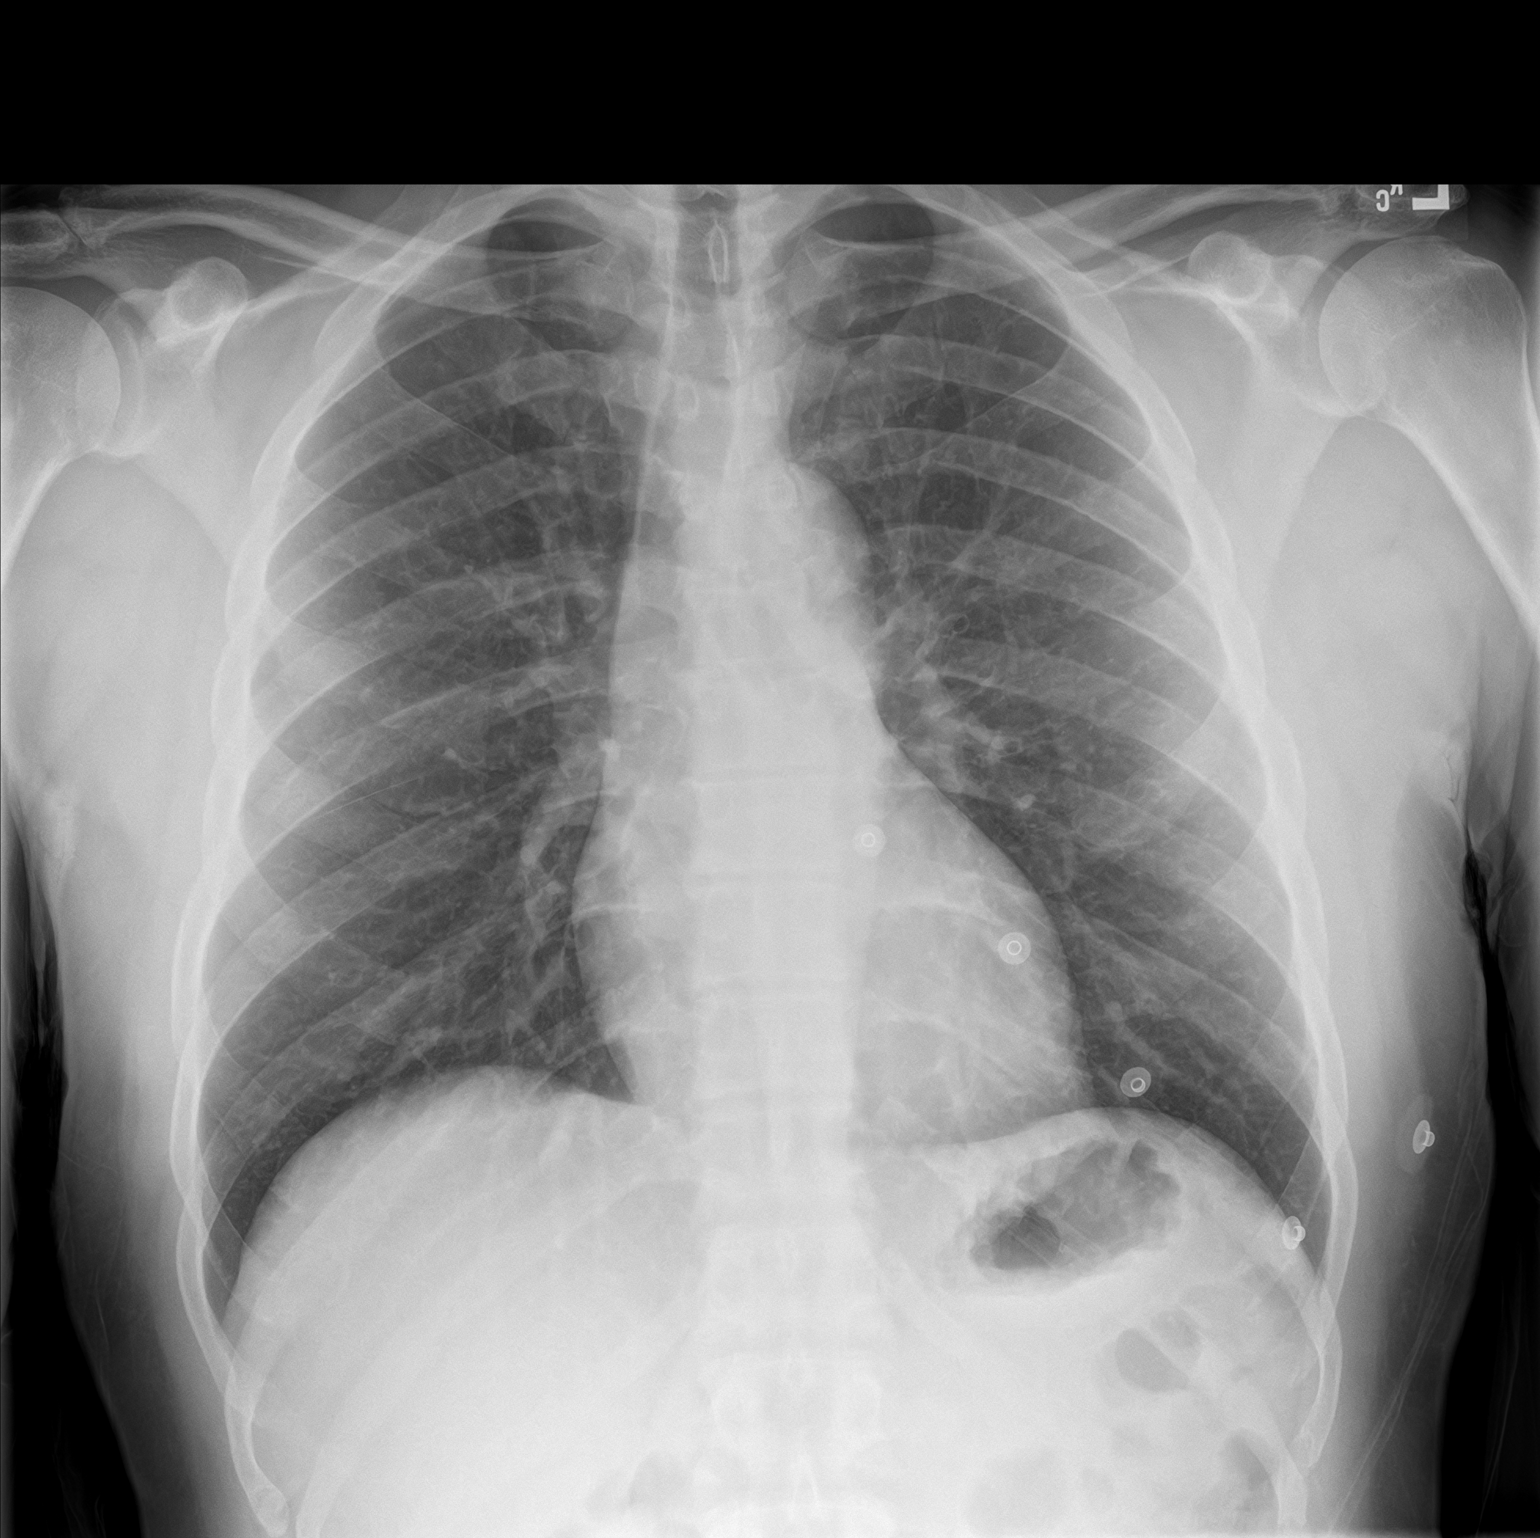
[im 2/2]
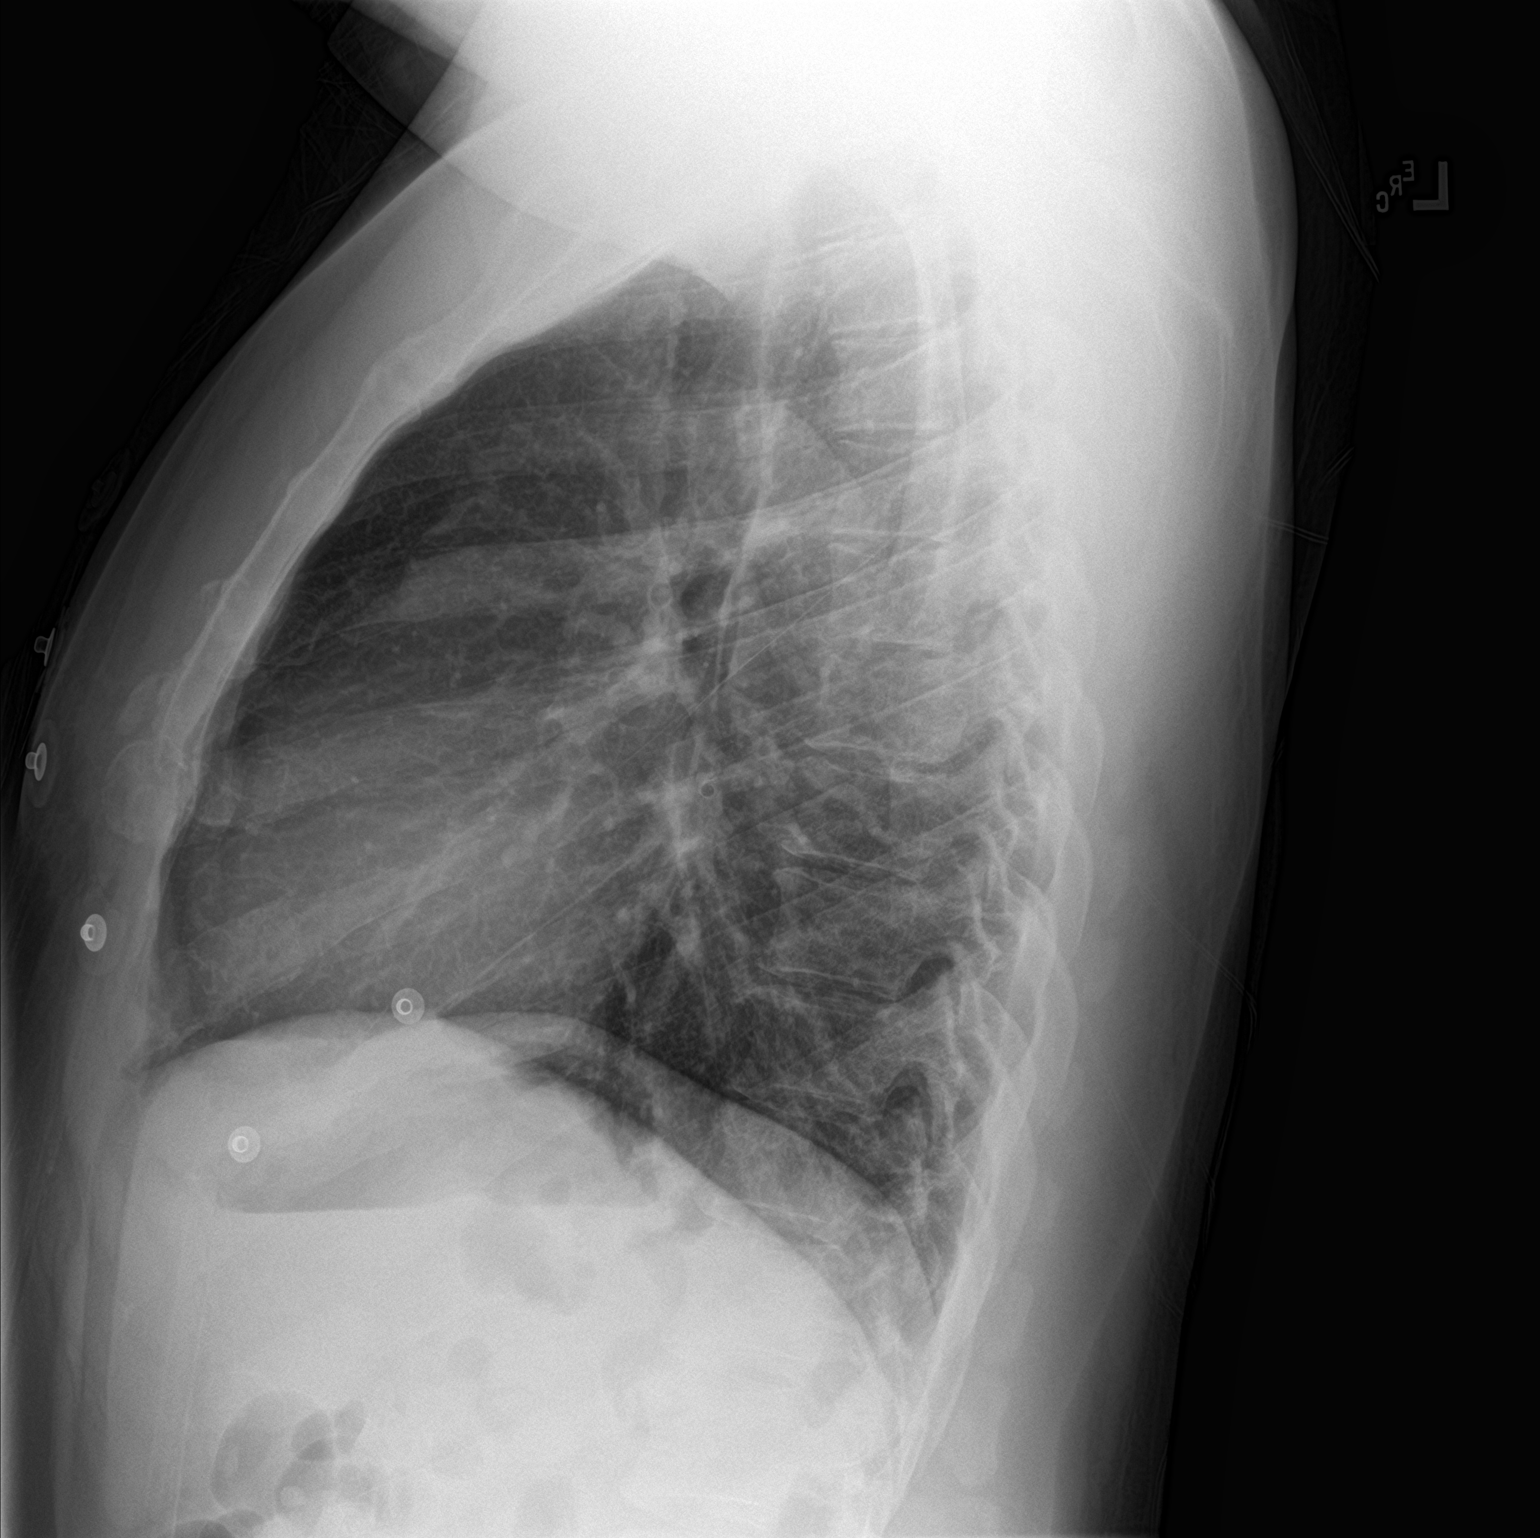

[2 of 2 positions shown; findings below may reference images not displayed]

FINDINGS: The heart size and mediastinal contours are within normal limits.
Both lungs are clear. The visualized skeletal structures are
unremarkable.
IMPRESSION: Negative two view chest x-ray.

## 2023-01-31 IMAGING — CR DG CHEST 2V
1 series · 2 of 2 positions shown · non-contrast
Comparison: None.

CLINICAL DATA: Shortness of breath

EXAM:
CHEST - 2 VIEW

[Series 1: dg chest 2 view · 0.14mm/px · 2 of 2 slices shown]
[im 1/2]
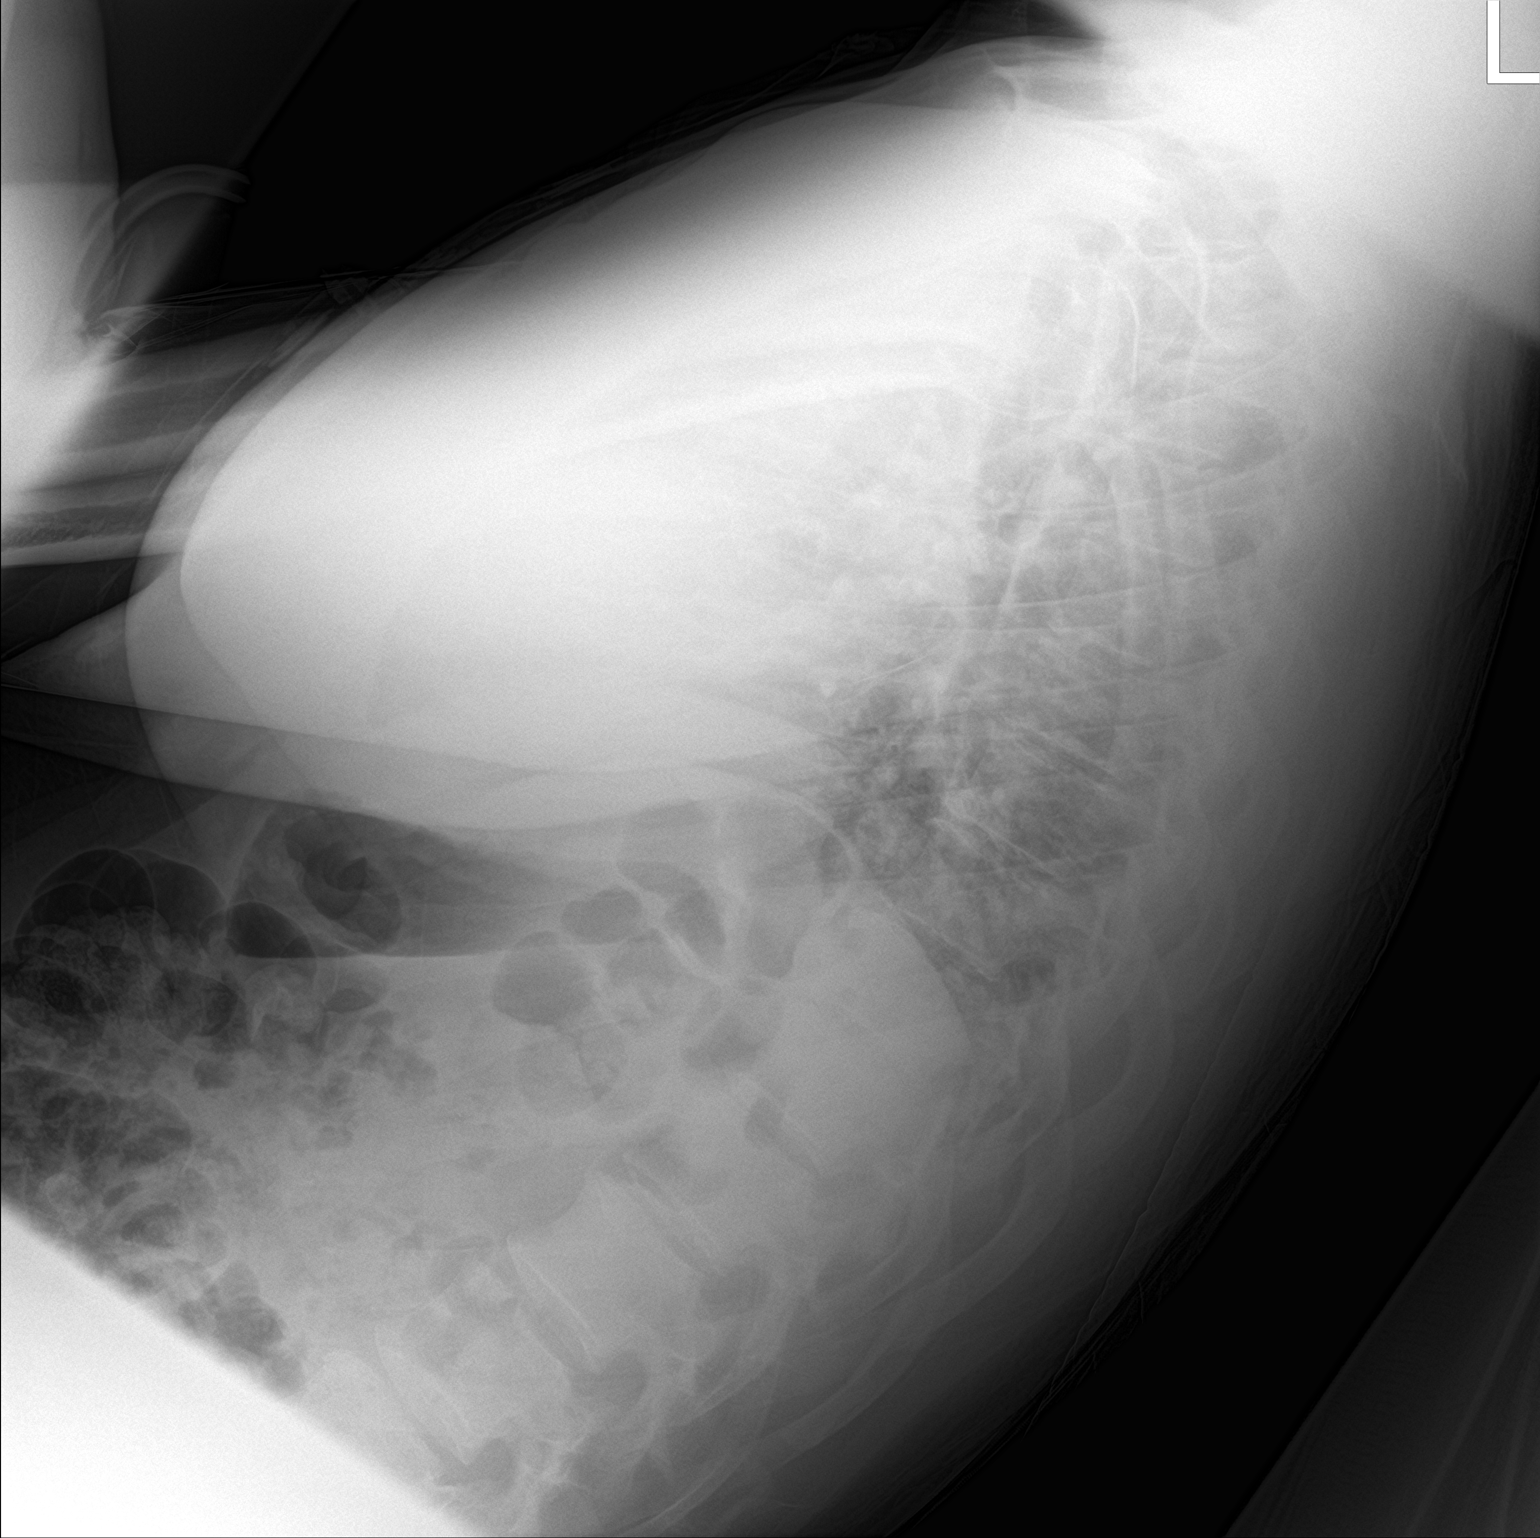
[im 2/2]
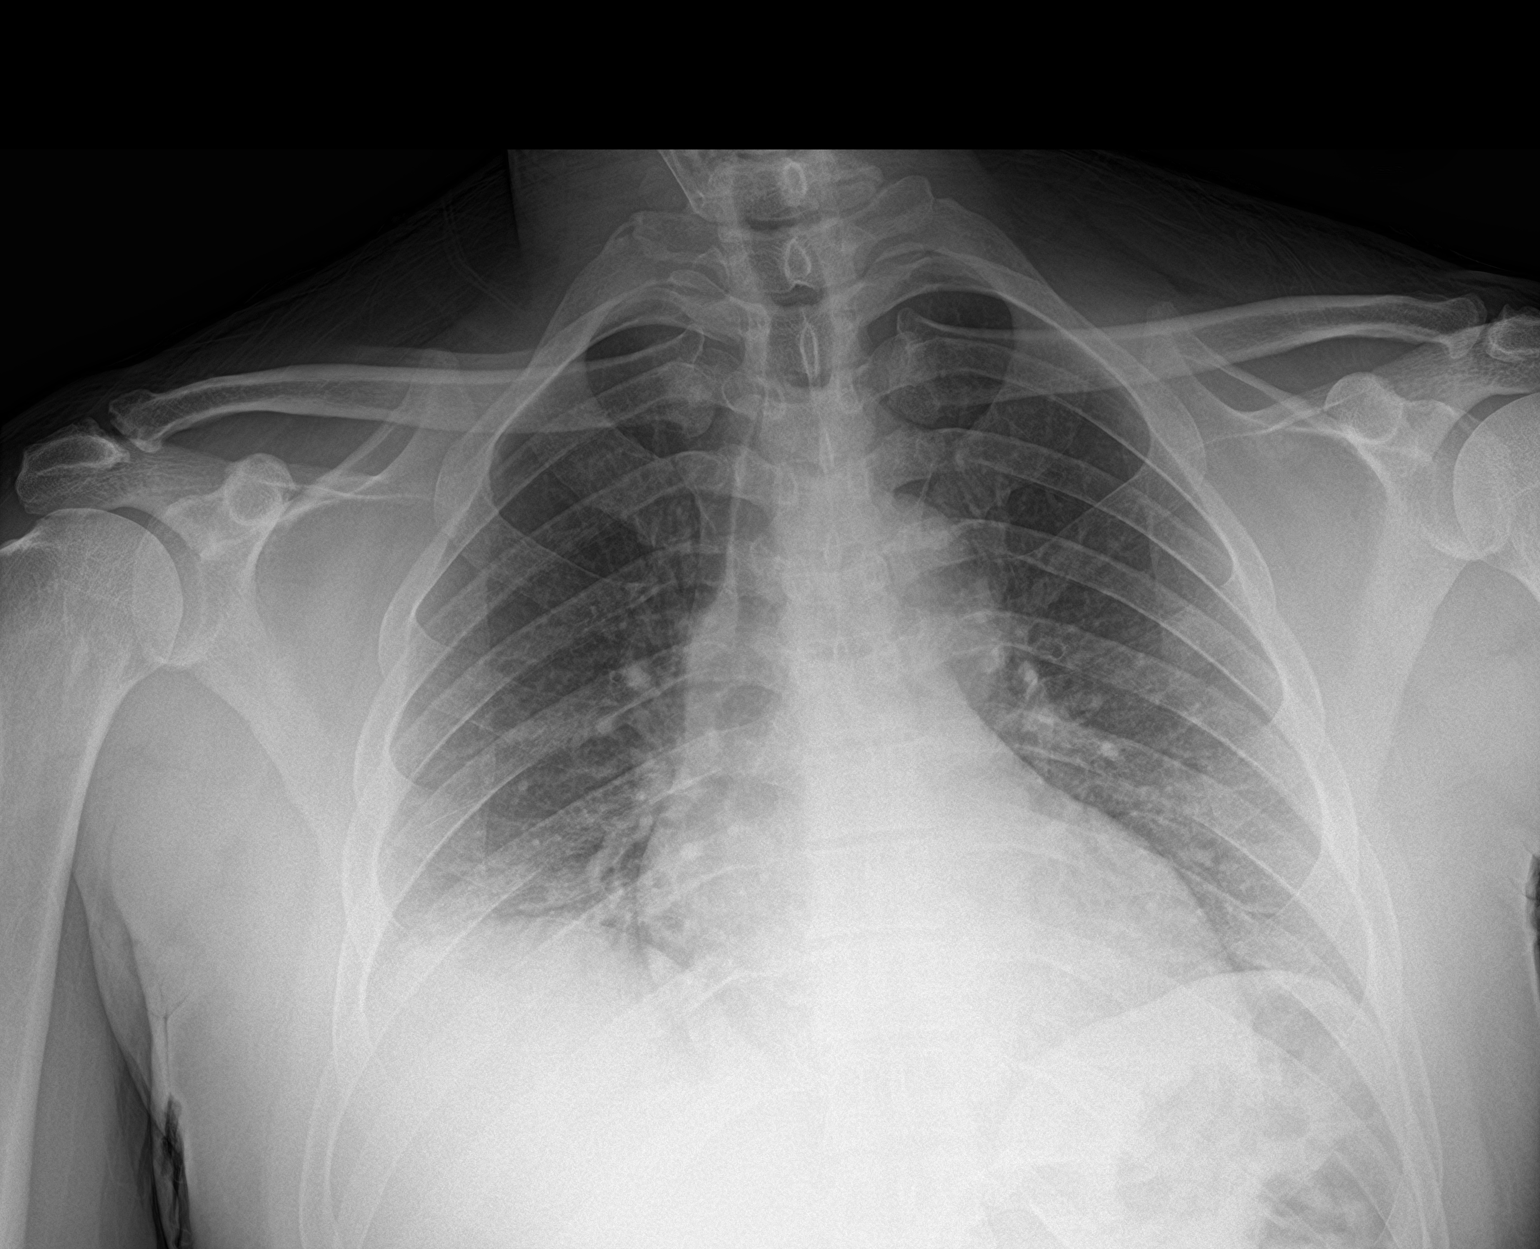

[2 of 2 positions shown; findings below may reference images not displayed]

FINDINGS: Right base atelectasis or infiltrate. Left lung clear. Heart is
normal size. No effusions or acute bony abnormality.
IMPRESSION: Right base atelectasis or infiltrate.

## 2023-08-15 ENCOUNTER — Ambulatory Visit: Payer: Self-pay | Admitting: Nurse Practitioner

## 2023-08-15 DIAGNOSIS — N341 Nonspecific urethritis: Secondary | ICD-10-CM

## 2023-08-15 DIAGNOSIS — Z113 Encounter for screening for infections with a predominantly sexual mode of transmission: Secondary | ICD-10-CM

## 2023-08-15 LAB — GRAM STAIN

## 2023-08-15 LAB — HM HIV SCREENING LAB: HM HIV Screening: NEGATIVE

## 2023-08-15 LAB — HEPATITIS B SURFACE ANTIGEN

## 2023-08-15 LAB — HM HEPATITIS C SCREENING LAB: HM Hepatitis Screen: NEGATIVE

## 2023-08-15 MED ORDER — DOXYCYCLINE HYCLATE 100 MG PO TABS: 100.0000 mg | ORAL_TABLET | Freq: Two times a day (BID) | ORAL | Status: AC

## 2023-08-15 NOTE — Progress Notes (Signed)
 Pt is here for STD testing.  Gram stain results reviewed. The patient was dispensed Doxycycline 100 mg #14 today. I provided counseling today regarding the medication. We discussed the medication, the side effects and when to call clinic. Patient given the opportunity to ask questions. Questions answered.  Condoms declined.  Berdie Ogren, RN

## 2023-08-18 LAB — CHLAMYDIA/GC NAA, CONFIRMATION
Chlamydia trachomatis, NAA: NEGATIVE
Neisseria gonorrhoeae, NAA: NEGATIVE

## 2023-08-26 ENCOUNTER — Encounter: Payer: Self-pay | Admitting: Nurse Practitioner

## 2023-08-26 NOTE — Progress Notes (Signed)
 [facility] Department STI clinic [address], [address] [place] [address] Main phone: [phone]  STI screening visit  Subjective:  Kenneth Yang is a 58 y.o. male being seen today for an STI screening visit. The patient reports they do have symptoms.    Patient has the following medical conditions:  Patient Active Problem List   Diagnosis Date Noted   Cocaine-induced psychotic disorder with hallucinations ([facility]) 06/17/2021   Cocaine use disorder, severe, dependence ([facility]) 02/04/2021    Chief Complaint  Patient presents with   SEXUALLY TRANSMITTED DISEASE    Burning when I urinate   Patient is a pleasant 58 y.o. male who presents to the office today requesting symptomatic STI testing.  He indicates he is having burning with urination that has been occurring for 1 week. He denies penile discharge.  He reports 1 male partner in the last 2 months, and practices penile/vaginal penetrative sex and oral sex. Patient reports using condoms sometimes. Patient indicates a history of chlamydia about 15 years ago. He reports last sex was about 2 weeks ago.    STI screening history: Last HIV test per patient/review of record was No results found for: "HMHIVSCREEN" No results found for: "HIV"  Last HEPC test per patient/review of record was No results found for: "HMHEPCSCREEN" No components found for: "HEPC"   Last HEPB test per patient/review of record was No components found for: "HMHEPBSCREEN"   Fertility: Does the patient or their partner desires a pregnancy in the next year? No  Screening for MPX risk: Does the patient have an unexplained rash? No Is the patient MSM? No Does the patient endorse multiple sex partners or anonymous sex partners? No Did the patient have close or sexual contact with a person diagnosed with MPX? No Has the patient traveled outside the [place] where MPX is endemic? No Is there a high clinical suspicion for MPX-- evidenced by  one of the following No  -Unlikely to be chickenpox  -Lymphadenopathy  -Rash that present in same phase of evolution on any given body part   See flowsheet for further details and programmatic requirements.   Immunization History  Administered Date(s) Administered   PFIZER Comirnaty(Gray Top)Covid-19 Tri-Sucrose Vaccine 10/09/2020   PFIZER(Purple Top)SARS-COV-2 Vaccination 01/10/2020, 01/31/2020     The following portions of the patient's history were reviewed and updated as appropriate: allergies, current medications, past medical history, past social history, past surgical history and problem list.  Objective:  There were no vitals filed for this visit.  Physical Exam Nursing note reviewed. Exam conducted with a chaperone present.  Constitutional:      Appearance: Normal appearance.  HENT:     Head: Normocephalic.     Salivary Glands: Right salivary gland is not diffusely enlarged or tender. Left salivary gland is not diffusely enlarged or tender.     Mouth/Throat:     Lips: Pink. No lesions.     Mouth: Mucous membranes are moist. No oral lesions.     Tongue: No lesions. Tongue does not deviate from midline.     Pharynx: Oropharynx is clear. Uvula midline. No oropharyngeal exudate or posterior oropharyngeal erythema.     Tonsils: No tonsillar exudate.  Eyes:     General:        Right eye: No discharge.        Left eye: No discharge.     Conjunctiva/sclera:     Right eye: Right conjunctiva is not injected. No exudate.  Left eye: Left conjunctiva is not injected. No exudate. Pulmonary:     Effort: Pulmonary effort is normal.  Genitourinary:    Pubic Area: No rash or pubic lice.      Penis: Normal. No tenderness, discharge, swelling or lesions.      Testes: Normal.     Epididymis:     Right: Normal. No mass or tenderness.     Left: Normal. No mass or tenderness.     Tanner stage (genital): 5.  Lymphadenopathy:     Head:     Right side of head: No submental,  submandibular, tonsillar, preauricular or posterior auricular adenopathy.     Left side of head: No submental, submandibular, tonsillar, preauricular or posterior auricular adenopathy.     Cervical: No cervical adenopathy.     Right cervical: No superficial or posterior cervical adenopathy.    Left cervical: No superficial or posterior cervical adenopathy.     Upper Body:     Right upper body: No supraclavicular or axillary adenopathy.     Left upper body: No supraclavicular or axillary adenopathy.     Lower Body: No right inguinal adenopathy. No left inguinal adenopathy.  Skin:    General: Skin is warm and dry.     Findings: No lesion or rash.     Comments: Skin tone appropriate for ethnicity.   Neurological:     Mental Status: He is alert and oriented to person, place, and time.  Psychiatric:        Attention and Perception: Attention and perception normal.        Mood and Affect: Mood and affect normal.        Speech: Speech normal.        Behavior: Behavior normal. Behavior is cooperative.        Thought Content: Thought content normal.     Assessment and Plan:  Kenneth Yang is a 58 y.o. male presenting to the [facility] Department for STI screening  1. Screening for venereal disease (Primary)  - HBV Antigen/Antibody State Lab - HIV/HCV [facility] - Gram stain - Syphilis Serology, [facility] - Chlamydia/GC NAA, Confirmation  2. NGU (nongonococcal urethritis) Treating penile gram stain positive for >2WBC/hpf per CDC guidelines.  - doxycycline (VIBRA-TABS) 100 MG tablet; Take 1 tablet (100 mg total) by mouth 2 (two) times daily for 7 days.  Patient does have STI symptoms Patient accepted all screenings including  urine GC/Chlamydia, and blood work for HIV/Syphilis. Patient meets criteria for HepB screening? Yes. Ordered? yes Patient meets criteria for HepC screening? Yes. Ordered? yes Recommended condom use with all sex Discussed importance of condom  use for STI prevention  Treat positive test results per standing order. Discussed time line for State Lab results and that patient will be called with positive results and encouraged patient to call if he had not heard in 2 weeks Recommended repeat testing in 3 months with positive results. Recommended returning for continued or worsening symptoms.   Return if symptoms worsen or fail to improve.  No future appointments.  Total time with patient 30 minutes.  Edmonia James, NP

## 2023-08-30 ENCOUNTER — Telehealth: Payer: Self-pay

## 2023-08-30 DIAGNOSIS — Z8619 Personal history of other infectious and parasitic diseases: Secondary | ICD-10-CM | POA: Insufficient documentation

## 2023-08-30 NOTE — Telephone Encounter (Addendum)
 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 Advised HIV/HepC/CL/GC were all negative. Hep B results shows Immune to Hep B due to resolved infection. Educated/counseled on Hep B.

## 2023-11-29 ENCOUNTER — Ambulatory Visit: Payer: Self-pay

## 2023-12-05 ENCOUNTER — Ambulatory Visit: Payer: Self-pay | Admitting: Nurse Practitioner

## 2023-12-05 ENCOUNTER — Encounter: Payer: Self-pay | Admitting: Nurse Practitioner

## 2023-12-05 DIAGNOSIS — Z113 Encounter for screening for infections with a predominantly sexual mode of transmission: Secondary | ICD-10-CM

## 2023-12-05 DIAGNOSIS — N341 Nonspecific urethritis: Secondary | ICD-10-CM

## 2023-12-05 LAB — GRAM STAIN

## 2023-12-05 MED ORDER — DOXYCYCLINE HYCLATE 100 MG PO TABS
100.0000 mg | ORAL_TABLET | Freq: Two times a day (BID) | ORAL | 0 refills | Status: AC
Start: 2023-12-05 — End: 2023-12-12

## 2023-12-05 NOTE — Progress Notes (Signed)
 [facility] Department STI clinic [address] [place] [address] Main phone: [phone]  STI screening visit  Subjective:  Kenneth Yang is a 58 y.o. male being seen today for an STI screening visit. The patient reports they do have symptoms.    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 male who presents to the office today requesting symptomatic STI testing.  Patient indicated symptoms include a ting to the penis. He denies rashes, discharge, lesions, or itching.  He reports 3 male partners in the last 2 months, and practices penile/vaginal penetrative sex. Patient reports uses condoms always. Patient indicates an STI history of chlamydia. He reports last sex was about 1 week ago.  See flowsheet for further details and programmatic requirements  Hyperlink available at the top of the signed note in blue.  Flow sheet content below:  Pregnancy Intention Screening Does the patient want to become pregnant in the next year?: No Does the patient's partner want to become pregnant in the next year?: No Would the patient like to discuss contraceptive options today?: No Reason For STD Screen STD Screening: Has symptoms Have you ever had an STD?: Yes History of Antibiotic use in the past 2 weeks?: No STD Symptoms Denies all: No Genital Itching: No Lower abdominal pain: No Discharge: No Dysuria: No Genital ulcer / lesion: No Rash: No Oral / Other skin ulcer: No Pain with sex: No Sore Throat: No Visual Changes: No Other (Describe in Comments): Yes Other s/s: A tinge to the tip of the  penis Risk Factors for Hep B Household, sexual, or needle sharing contact of a person infected with Hep B: No Sexual contact with a person who uses drugs not as prescribed?: No Currently or Ever used drugs not as prescribed: No HIV Positive: No PRep Patient: No Men who have sex with men: No Have Hepatitis C: No History of Incarceration: Yes History of Homeslessness?: Yes Anal sex following anal drug use?: No Risk Factors for Hep C Currently using drugs not as prescribed: No Sexual partner(s) currently using drugs as not prescribed: No History of drug use: Yes HIV Positive: No People with a history of incarceration: Yes People born between the years of 14 and 31: No Hepatitis Counseling Hep B Counseling: Counseled patient about increased risk of Hep B and recommendation for testing, Patient declines testing for Hep B today Hep C Counseling: Counseled patient about increased risk of Hep C and recommendation for testing, Patient declines testing for Hep C today Abuse History Has patient ever been abused physically?: No Has patient ever been abused sexually?: No Does patient feel they have a problem with Anxiety?: No Does patient feel they have a problem with Depression?: No Referral to Behavioral Health: N/A Counseling Patient counseled to use condoms with all sex: Condoms declined RTC in 2-3 weeks for test results: Yes Clinic will call if test results abnormal before test result appt.: Yes Test results given to patient Patient counseled to use condoms with all sex: Condoms declined Contraception Wrap Up Current Method: Male Condom End Method: Male Condom Contraception Counseling Provided: Yes How was the  end contraceptive method provided?: N/A  Screening for MPX risk: Does the patient have an unexplained rash? No Is the patient MSM? No Does the patient endorse multiple sex partners or anonymous sex partners? Yes Did the patient have close or sexual contact with a person  diagnosed with MPX? No Has the patient traveled outside the [place]  where MPX is endemic? No Is there a high clinical suspicion for MPX-- evidenced by one of the following No  -Unlikely to be chickenpox  -Lymphadenopathy  -Rash that present in same phase of evolution on any given body part  STI screening history: Last HIV test per patient/review of record was  Lab Results  Component Value Date   HMHIVSCREEN Negative - Validated 08/15/2023   No results found for: HIV  Last HEPC test per patient/review of record was  Lab Results  Component Value Date   HMHEPCSCREEN Negative-Validated 08/15/2023   No components found for: HEPC   Last HEPB test per patient/review of record was No components found for: HMHEPBSCREEN   Fertility: Does the patient or their partner desires a pregnancy in the next year? No  Immunization History  Administered Date(s) Administered   PFIZER Comirnaty(Gray Top)Covid-19 Tri-Sucrose Vaccine 10/09/2020   PFIZER(Purple Top)SARS-COV-2 Vaccination 01/10/2020, 01/31/2020    The following portions of the patient's history were reviewed and updated as appropriate: allergies, current medications, past medical history, past social history, past surgical history and problem list.  Objective:  There were no vitals filed for this visit.  Physical Exam Nursing note reviewed.  Constitutional:      Appearance: Normal appearance.  HENT:     Head: Normocephalic.     Salivary Glands: Right salivary gland is not diffusely enlarged or tender. Left salivary gland is not diffusely enlarged or tender.     Mouth/Throat:     Lips: Pink. No lesions.     Mouth: Mucous membranes are moist. No oral lesions.     Tongue: No lesions. Tongue does not deviate from midline.     Pharynx: Oropharynx is clear. Uvula midline. No oropharyngeal exudate or posterior oropharyngeal erythema.     Tonsils: No tonsillar exudate.   Eyes:     General:        Right eye: No discharge.        Left  eye: No discharge.     Conjunctiva/sclera:     Right eye: Right conjunctiva is not injected. No exudate.    Left eye: Left conjunctiva is not injected. No exudate.  Pulmonary:     Effort: Pulmonary effort is normal.  Genitourinary:    Pubic Area: No rash or pubic lice.      Penis: Normal and uncircumcised. No tenderness, discharge, swelling or lesions.      Testes: Normal.     Epididymis:     Right: Normal. No mass or tenderness.     Left: Normal. No mass or tenderness.     Tanner stage (genital): 5.  Lymphadenopathy:     Head:     Right side of head: No submental, submandibular, tonsillar, preauricular or posterior auricular adenopathy.     Left side of head: No submental, submandibular, tonsillar, preauricular or posterior auricular adenopathy.     Cervical: No cervical adenopathy.     Right cervical: No superficial or posterior cervical adenopathy.    Left cervical: No superficial or posterior cervical adenopathy.     Upper Body:     Right upper body: No supraclavicular or axillary adenopathy.  Left upper body: No supraclavicular or axillary adenopathy.     Lower Body: No right inguinal adenopathy. No left inguinal adenopathy.   Skin:    General: Skin is warm and dry.     Findings: No lesion or rash.     Comments: Skin tone appropriate for ethnicity.    Neurological:     Mental Status: He is alert and oriented to person, place, and time.   Psychiatric:        Attention and Perception: Attention and perception normal.        Mood and Affect: Mood and affect normal.        Speech: Speech normal.        Behavior: Behavior normal. Behavior is cooperative.        Thought Content: Thought content normal.     Assessment and Plan:  Kenneth Yang is a 58 y.o. male presenting to the [facility] Department for STI screening  1. Screening examination for STD (sexually transmitted disease) (Primary)  - Chlamydia/GC NAA, Confirmation - Gram stain  2. NGU  (nongonococcal urethritis) Gram stain positive for >2WBC/hpf in office today. Treating as NGU.  - doxycycline  (VIBRA -TABS) 100 MG tablet; Take 1 tablet (100 mg total) by mouth 2 (two) times daily for 7 days.  Dispense: 14 tablet; Refill: 0   Patient does have STI symptoms Patient accepted the following screenings: penile gram stain for GC and urine CT/GC Patient meets criteria for HepB screening? Yes. Ordered? No-Patient declined.  Patient meets criteria for HepC screening? Yes. Ordered? No-Patient declined.  Recommended condom use with all sex Discussed importance of condom use for STI prevention  Treat positive test results per standing order. Discussed time line for State Lab results and that patient will be called with positive results and encouraged patient to call if he had not heard in 2 weeks Recommended repeat testing in 3 months with positive results. Recommended returning for continued or worsening symptoms.   Return if symptoms worsen or fail to improve.  No future appointments.  The level of service of this patient encounter was coded based on the total time (30 minutes) spent both face-to-face and non-face-to-face, including pre-visit, intra-visit, and post-visit activities.   Clarita LITTIE Narrow, NP

## 2023-12-05 NOTE — Progress Notes (Signed)
 Pt is here for std screening. Gaspar Garbe, RN

## 2023-12-09 LAB — CHLAMYDIA/GC NAA, CONFIRMATION
Chlamydia trachomatis, NAA: NEGATIVE
Neisseria gonorrhoeae, NAA: NEGATIVE

## 2024-03-26 ENCOUNTER — Emergency Department
Admission: EM | Admit: 2024-03-26 | Discharge: 2024-03-26 | Disposition: A | Discharge status: Home / Self Care | Payer: Self-pay | Attending: Emergency Medicine | Admitting: Emergency Medicine

## 2024-03-26 DIAGNOSIS — K30 Functional dyspepsia: Secondary | ICD-10-CM | POA: Insufficient documentation

## 2024-03-26 NOTE — ED Notes (Signed)
Vol pending consult 

## 2024-03-26 NOTE — ED Provider Notes (Signed)
 [facility] Provider Note    Event Date/Time   First MD Initiated Contact with Patient 03/26/24 1849     (approximate)   History   Drug / Alcohol Assessment   HPI  Kenneth Yang is a 58 y.o. male  who presents to the emergency department today from [facility] because they had concern that he had chest pain. The patient himself states that he had some indigestion, which he has had before, and that at the time of my exam he is not having any discomfort. He does not want to be worked up for chest pain at this time. Per chart review he was seen at OSH for chest pain about a month ago without clear etiology.       Physical Exam   Triage Vital Signs: ED Triage Vitals  Encounter Vitals Group     BP 03/26/24 1848 122/83     Girls Systolic BP Percentile --      Girls Diastolic BP Percentile --      Boys Systolic BP Percentile --      Boys Diastolic BP Percentile --      Pulse Rate 03/26/24 1848 70     Resp 03/26/24 1848 18     Temp 03/26/24 1848 98.1 F (36.7 C)     Temp Source 03/26/24 1848 Oral     SpO2 03/26/24 1848 98 %     Weight 03/26/24 1842 200 lb (90.7 kg)     Height 03/26/24 1842 5' 8 (1.727 m)     Head Circumference --      Peak Flow --      Pain Score 03/26/24 1842 0     Pain Loc --      Pain Education --      Exclude from Growth Chart --     Most recent vital signs: Vitals:   03/26/24 1848  BP: 122/83  Pulse: 70  Resp: 18  Temp: 98.1 F (36.7 C)  SpO2: 98%   General: Awake, alert, oriented. CV:  Good peripheral perfusion.  Resp:  Normal effort.  Abd:  No distention.    ED Results / Procedures / Treatments   Labs (all labs ordered are listed, but only abnormal results are displayed) Labs Reviewed - No data to display   EKG  None   RADIOLOGY None   PROCEDURES:  Critical Care performed: No   MEDICATIONS ORDERED IN ED: Medications - No data to display   IMPRESSION / MDM / ASSESSMENT AND PLAN / ED COURSE  I  reviewed the triage vital signs and the nursing notes.                              Differential diagnosis includes, but is not limited to, indigestion, ACS, pneumonia, PE  Patient's presentation is most consistent with acute presentation with potential threat to life or bodily function.   Patient presented to the emergency department today from [facility] because [facility] had concerned that he had some chest pain.  The patient himself states that he had indigestion.  He declines a workup for chest pain at this time.  Will discharge.     FINAL CLINICAL IMPRESSION(S) / ED DIAGNOSES   Final diagnoses:  Indigestion        Rx / DC Orders   ED Discharge Orders          Ordered    Ambulatory Referral to Primary Care (  Establish Care)        03/26/24 1853             Note:  This document was prepared using Dragon voice recognition software and may include unintentional dictation errors.    Floy Roberts, MD 03/26/24 [phone]

## 2024-03-26 NOTE — ED Triage Notes (Signed)
 Pt BIB [facility] from [facility]. Pt reports cocaine and alcohol use today. [facility] sent him due to possible chest pain/ indigestion. Pt denies any chest pain. Pt denies SI/HI.   No past medical history on file.

## 2024-04-25 ENCOUNTER — Inpatient Hospital Stay
Admission: AD | Admit: 2024-04-25 | Discharge: 2024-05-01 | DRG: 897 | Disposition: A | Discharge status: Home / Self Care | Payer: 59 | Source: Intra-hospital | Attending: Psychiatry | Admitting: Psychiatry

## 2024-04-25 ENCOUNTER — Other Ambulatory Visit: Payer: Self-pay

## 2024-04-25 ENCOUNTER — Encounter: Payer: Self-pay | Admitting: Psychiatry

## 2024-04-25 ENCOUNTER — Emergency Department
Admission: EM | Admit: 2024-04-25 | Discharge: 2024-04-25 | Disposition: A | Discharge status: Other Acute Inpatient Hospital | Payer: Self-pay | Attending: Emergency Medicine | Admitting: Emergency Medicine

## 2024-04-25 ENCOUNTER — Emergency Department: Payer: Self-pay

## 2024-04-25 DIAGNOSIS — Z5941 Food insecurity: Secondary | ICD-10-CM

## 2024-04-25 DIAGNOSIS — R45851 Suicidal ideations: Principal | ICD-10-CM | POA: Diagnosis present

## 2024-04-25 DIAGNOSIS — I10 Essential (primary) hypertension: Secondary | ICD-10-CM | POA: Diagnosis present

## 2024-04-25 DIAGNOSIS — Z5982 Transportation insecurity: Secondary | ICD-10-CM | POA: Diagnosis not present

## 2024-04-25 DIAGNOSIS — F1994 Other psychoactive substance use, unspecified with psychoactive substance-induced mood disorder: Secondary | ICD-10-CM | POA: Diagnosis not present

## 2024-04-25 DIAGNOSIS — F32A Depression, unspecified: Secondary | ICD-10-CM | POA: Diagnosis present

## 2024-04-25 DIAGNOSIS — F17290 Nicotine dependence, other tobacco product, uncomplicated: Secondary | ICD-10-CM | POA: Diagnosis present

## 2024-04-25 DIAGNOSIS — R079 Chest pain, unspecified: Secondary | ICD-10-CM | POA: Insufficient documentation

## 2024-04-25 DIAGNOSIS — R45850 Homicidal ideations: Secondary | ICD-10-CM

## 2024-04-25 DIAGNOSIS — F17210 Nicotine dependence, cigarettes, uncomplicated: Secondary | ICD-10-CM | POA: Diagnosis present

## 2024-04-25 DIAGNOSIS — Z5901 Sheltered homelessness: Secondary | ICD-10-CM | POA: Diagnosis not present

## 2024-04-25 DIAGNOSIS — F14159 Cocaine abuse with cocaine-induced psychotic disorder, unspecified: Secondary | ICD-10-CM | POA: Diagnosis present

## 2024-04-25 DIAGNOSIS — Z5900 Homelessness unspecified: Secondary | ICD-10-CM | POA: Insufficient documentation

## 2024-04-25 DIAGNOSIS — F329 Major depressive disorder, single episode, unspecified: Secondary | ICD-10-CM | POA: Insufficient documentation

## 2024-04-25 DIAGNOSIS — F1410 Cocaine abuse, uncomplicated: Secondary | ICD-10-CM | POA: Insufficient documentation

## 2024-04-25 LAB — URINE DRUG SCREEN
Amphetamines: NEGATIVE
Barbiturates: NEGATIVE
Benzodiazepines: NEGATIVE
Cocaine: POSITIVE — AB
Fentanyl: NEGATIVE
Methadone Scn, Ur: NEGATIVE
Opiates: NEGATIVE
Tetrahydrocannabinol: NEGATIVE

## 2024-04-25 LAB — TROPONIN T, HIGH SENSITIVITY
Troponin T High Sensitivity: <15 ng/L (ref 0–19)
Troponin T High Sensitivity: <15 ng/L (ref 0–19)

## 2024-04-25 LAB — BASIC METABOLIC PANEL WITH GFR
Anion gap: 11 (ref 5–15)
BUN: 16 mg/dL (ref 6–20)
CO2: 23 mmol/L (ref 22–32)
Calcium: 9 mg/dL (ref 8.9–10.3)
Chloride: 104 mmol/L (ref 98–111)
Creatinine, Ser: 0.98 mg/dL (ref 0.61–1.24)
GFR, Estimated: >60 mL/min (ref >60)
Glucose, Bld: 181 mg/dL — ABNORMAL HIGH (ref 70–99)
Potassium: 3.8 mmol/L (ref 3.5–5.1)
Sodium: 138 mmol/L (ref 135–145)

## 2024-04-25 LAB — CBC
HCT: 43.9 % (ref 39.0–52.0)
Hemoglobin: 14.4 g/dL (ref 13.0–17.0)
MCH: 27.5 pg (ref 26.0–34.0)
MCHC: 32.8 g/dL (ref 30.0–36.0)
MCV: 83.8 fL (ref 80.0–100.0)
Platelets: 177 K/uL (ref 150–400)
RBC: 5.24 MIL/uL (ref 4.22–5.81)
RDW: 13 % (ref 11.5–15.5)
WBC: 3.7 K/uL — ABNORMAL LOW (ref 4.0–10.5)
nRBC: 0 % (ref 0.0–0.2)

## 2024-04-25 MED ORDER — ASPIRIN 81 MG PO CHEW
324.0000 mg | CHEWABLE_TABLET | Freq: Once | ORAL | Status: AC
  Administered 2024-04-25: 324 mg via ORAL
  Filled 2024-04-25: qty 4

## 2024-04-25 MED ORDER — HALOPERIDOL 5 MG PO TABS: 5.0000 mg | ORAL_TABLET | Freq: Three times a day (TID) | ORAL | Status: DC | PRN

## 2024-04-25 MED ORDER — TRAZODONE HCL 50 MG PO TABS
50.0000 mg | ORAL_TABLET | Freq: Every evening | ORAL | Status: DC | PRN
Start: 2024-04-25 — End: 2024-05-01
  Filled 2024-04-25: qty 1

## 2024-04-25 MED ORDER — FLUOXETINE HCL 20 MG PO CAPS
20.0000 mg | ORAL_CAPSULE | Freq: Every day | ORAL | Status: DC
  Filled 2024-04-25: qty 1

## 2024-04-25 MED ORDER — HALOPERIDOL LACTATE 5 MG/ML IJ SOLN: 10.0000 mg | Freq: Three times a day (TID) | INTRAMUSCULAR | Status: DC | PRN

## 2024-04-25 MED ORDER — LOPERAMIDE HCL 2 MG PO CAPS: 2.0000 mg | ORAL_CAPSULE | ORAL | Status: AC | PRN

## 2024-04-25 MED ORDER — DIPHENHYDRAMINE HCL 25 MG PO CAPS
50.0000 mg | ORAL_CAPSULE | Freq: Three times a day (TID) | ORAL | Status: DC | PRN
Start: 2024-04-25 — End: 2024-05-01

## 2024-04-25 MED ORDER — ALUM & MAG HYDROXIDE-SIMETH 200-200-20 MG/5ML PO SUSP
30.0000 mL | ORAL | Status: DC | PRN
Start: 2024-04-25 — End: 2024-05-01

## 2024-04-25 MED ORDER — ADULT MULTIVITAMIN W/MINERALS CH
1.0000 | ORAL_TABLET | Freq: Every day | ORAL | Status: DC
  Administered 2024-04-25 – 2024-04-30 (×5): 1 via ORAL
  Filled 2024-04-25 (×6): qty 1

## 2024-04-25 MED ORDER — OLANZAPINE 5 MG PO TABS
5.0000 mg | ORAL_TABLET | Freq: Two times a day (BID) | ORAL | Status: DC
  Filled 2024-04-25: qty 1

## 2024-04-25 MED ORDER — LORAZEPAM 2 MG/ML IJ SOLN: 2.0000 mg | Freq: Three times a day (TID) | INTRAMUSCULAR | Status: DC | PRN

## 2024-04-25 MED ORDER — ALUM & MAG HYDROXIDE-SIMETH 200-200-20 MG/5ML PO SUSP: 30.0000 mL | ORAL | Status: DC | PRN

## 2024-04-25 MED ORDER — MAGNESIUM HYDROXIDE 400 MG/5ML PO SUSP: 30.0000 mL | Freq: Every day | ORAL | Status: DC | PRN

## 2024-04-25 MED ORDER — CHLORDIAZEPOXIDE HCL 25 MG PO CAPS
25.0000 mg | ORAL_CAPSULE | Freq: Four times a day (QID) | ORAL | Status: DC
  Filled 2024-04-25 (×2): qty 1

## 2024-04-25 MED ORDER — NICOTINE 21 MG/24HR TD PT24
21.0000 mg | MEDICATED_PATCH | Freq: Every day | TRANSDERMAL | Status: DC
  Administered 2024-05-01: 21 mg via TRANSDERMAL
  Filled 2024-04-25 (×5): qty 1

## 2024-04-25 MED ORDER — HYDROXYZINE HCL 25 MG PO TABS: 25.0000 mg | ORAL_TABLET | Freq: Three times a day (TID) | ORAL | Status: DC | PRN

## 2024-04-25 MED ORDER — THIAMINE MONONITRATE 100 MG PO TABS
100.0000 mg | ORAL_TABLET | Freq: Every day | ORAL | Status: DC
  Administered 2024-04-27 – 2024-04-30 (×4): 100 mg via ORAL
  Filled 2024-04-25 (×4): qty 1

## 2024-04-25 MED ORDER — ACETAMINOPHEN 325 MG PO TABS: 650.0000 mg | ORAL_TABLET | Freq: Four times a day (QID) | ORAL | Status: DC | PRN

## 2024-04-25 MED ORDER — DIPHENHYDRAMINE HCL 50 MG/ML IJ SOLN: 50.0000 mg | Freq: Three times a day (TID) | INTRAMUSCULAR | Status: DC | PRN

## 2024-04-25 MED ORDER — CHLORDIAZEPOXIDE HCL 25 MG PO CAPS: 25.0000 mg | ORAL_CAPSULE | Freq: Every day | ORAL | Status: DC

## 2024-04-25 MED ORDER — CHLORDIAZEPOXIDE HCL 25 MG PO CAPS: 25.0000 mg | ORAL_CAPSULE | ORAL | Status: DC

## 2024-04-25 MED ORDER — ONDANSETRON 4 MG PO TBDP: 4.0000 mg | ORAL_TABLET | Freq: Four times a day (QID) | ORAL | Status: AC | PRN

## 2024-04-25 MED ORDER — HALOPERIDOL LACTATE 5 MG/ML IJ SOLN: 5.0000 mg | Freq: Three times a day (TID) | INTRAMUSCULAR | Status: DC | PRN

## 2024-04-25 MED ORDER — CHLORDIAZEPOXIDE HCL 25 MG PO CAPS: 25.0000 mg | ORAL_CAPSULE | Freq: Four times a day (QID) | ORAL | Status: AC | PRN

## 2024-04-25 MED ORDER — CHLORDIAZEPOXIDE HCL 25 MG PO CAPS: 25.0000 mg | ORAL_CAPSULE | Freq: Three times a day (TID) | ORAL | Status: DC

## 2024-04-25 NOTE — BH Assessment (Signed)
 Comprehensive Clinical Assessment (CCA) Screening, Triage and Referral Note  04/25/2024 Kenneth Yang [id number] Recommendations for Services/Supports/Treatments: Iris consult/Disposition pending. Kenneth Yang is a 58 year old, English speaking, Black male. Pt presented to [facility] ED voluntarily. Per triage note: [facility]. Patient complaints of chest pain under breast. Patient also admits crack and alcohol use, states he feels like he's ruined his life and needs help. Admits to SI, no intention, no active plan at this time. Due to EKG patient moved emergently to room 18 for Gordan, MD to evaluate.  HPI (History of Present Illness) Patient presents endorsing passive suicidal ideation (SI) without a specific plan, stating: "Having these thoughts; I'm in a dark place." Reports feeling unwell and expresses belief that he will not be able to live long this way. Patient admits to excessive crack cocaine use, last use 1 day ago, and inability to stop. Demonstrates good insight into how substance use compromises functioning and decision-making. Currently in the contemplation stage of change, expressing desire for substance abuse treatment and inpatient care. Psychiatric Assessment Mood: Depressed Affect: Tearful Thought Process: Relevant and intact Orientation: Oriented 4 SI: Passive, no plan HI: Denied AVH: Denied Stressors: Surveyor, Quantity strain, housing instability, lack of transportation, lack of support Functioning: Reports inability to function due to polysubstance use Employment: Unemployed Substance Use Primary Substance: Crack cocaine Pattern: Excessive use, last use 1 day ago Insight: Good; understands impact on functioning Stage of Change: Contemplation UDS: Positive for cocaine BAL: Unremarkable Risk Assessment Suicide Risk: Passive SI without plan; protective factor includes expressed desire for treatment Violence Risk: Denies  HI Other Risks: Continued substance use, psychosocial instability Mental Status Exam (MSE) Appearance: Appropriate Behavior: Cooperative Speech: Normal rate and tone Thought Content: No delusions or hallucinations Cognition: Intact Insight/Judgment: Fair to good Chief Complaint:  Chief Complaint  Patient presents with   Chest Pain   Mental Health Problem   Visit Diagnosis: Cocaine use disorder, severe, dependence (HCC)   Patient Reported Information How did you hear about us ? No data recorded What Is the Reason for Your Visit/Call Today? No data recorded How Long Has This Been Causing You Problems? No data recorded What Do You Feel Would Help You the Most Today? No data recorded  Have You Recently Had Any Thoughts About Hurting Yourself? No data recorded Are You Planning to Commit Suicide/Harm Yourself At This time? No data recorded  Have you Recently Had Thoughts About Hurting Someone Sherral? No data recorded Are You Planning to Harm Someone at This Time? No data recorded Explanation: No data recorded  Have You Used Any Alcohol or Drugs in the Past 24 Hours? No data recorded How Long Ago Did You Use Drugs or Alcohol? No data recorded What Did You Use and How Much? No data recorded  Do You Currently Have a Therapist/Psychiatrist? No data recorded Name of Therapist/Psychiatrist: No data recorded  Have You Been Recently Discharged From Any Office Practice or Programs? No data recorded Explanation of Discharge From Practice/Program: No data recorded   CCA Screening Triage Referral Assessment Type of Contact: No data recorded Telemedicine Service Delivery:   Is this Initial or Reassessment?   Date Telepsych consult ordered in [facility]:    Time Telepsych consult ordered in [facility]:    Location of Assessment: No data recorded Provider Location: No data recorded   Collateral Involvement: No data recorded  Does Patient Have a Court Appointed Legal Guardian? No data recorded Name and  Contact of Legal Guardian:  No data recorded If Minor and Not Living with Parent(s), Who has Custody? No data recorded Is CPS involved or ever been involved? No data recorded Is APS involved or ever been involved? No data recorded  Patient Determined To Be At Risk for Harm To Self or Others Based on Review of Patient Reported Information or Presenting Complaint? No data recorded Method: No data recorded Availability of Means: No data recorded Intent: No data recorded Notification Required: No data recorded Additional Information for Danger to Others Potential: No data recorded Additional Comments for Danger to Others Potential: No data recorded Are There Guns or Other Weapons in Your Home? No data recorded Types of Guns/Weapons: No data recorded Are These Weapons Safely Secured?                            No data recorded Who Could Verify You Are Able To Have These Secured: No data recorded Do You Have any Outstanding Charges, Pending Court Dates, Parole/Probation? No data recorded Contacted To Inform of Risk of Harm To Self or Others: No data recorded  Does Patient Present under Involuntary Commitment? No data recorded   [place] of Residence: No data recorded  Patient Currently Receiving the Following Services: No data recorded  Determination of Need: No data recorded  Options For Referral: No data recorded  Disposition Recommendation per psychiatric provider: Pending psych consult  Daniela Hernan R Alie Moudy, LCAS

## 2024-04-25 NOTE — Progress Notes (Signed)
   04/25/24 1600  Psych Admission Type (Psych Patients Only)  Admission Status Voluntary  Psychosocial Assessment  Patient Complaints Agitation;Anxiety;Decreased concentration;Depression  Eye Contact Avertive  Facial Expression Flat  Affect Flat  Speech Logical/coherent  Interaction Isolative  Motor Activity Slow  Appearance/Hygiene Body odor  Behavior Characteristics Appropriate to situation  Mood Angry;Anxious;Apathetic  Thought Process  Coherency WDL  Content Blaming others;Preoccupation;Paranoia  Delusions None reported or observed  Perception WDL  Hallucination None reported or observed  Judgment Poor  Confusion WDL  Danger to Self  Current suicidal ideation? Denies  Danger to Others  Danger to Others None reported or observed

## 2024-04-25 NOTE — ED Notes (Signed)
Vol /psych consult ordered/ pending  

## 2024-04-25 NOTE — ED Provider Notes (Signed)
 [facility] Provider Note    Event Date/Time   First MD Initiated Contact with Patient 04/25/24 0220     (approximate)   History   Chest Pain and Mental Health Problem   HPI Kenneth Yang is a 58 y.o. male who denies a history of cardiac disease but reports ongoing cocaine abuse.  He presents tonight by EMS from the homeless shelter.  He states that he is having chest pain that is in the lower part of his chest right along the line of his diaphragm.  He said he most recently used cocaine tonight and has been using it fairly constantly for the last 2 months.  No shortness of breath.  No numbness or weakness in his arms or his legs.  Initial triage EKG had a worrisome ST segment elevation only in lead V2, so I had him brought to a room emergently for repeat EKG and rapid evaluation.     Physical Exam   Triage Vital Signs: ED Triage Vitals  Encounter Vitals Group     BP --      Girls Systolic BP Percentile --      Girls Diastolic BP Percentile --      Boys Systolic BP Percentile --      Boys Diastolic BP Percentile --      Pulse Rate 04/25/24 0217 79     Resp 04/25/24 0217 16     Temp 04/25/24 0217 98.1 F (36.7 C)     Temp Source 04/25/24 0217 Oral     SpO2 04/25/24 0217 99 %     Weight 04/25/24 0212 91 kg (200 lb 9.9 oz)     Height 04/25/24 0212 1.727 m (5' 8)     Head Circumference --      Peak Flow --      Pain Score 04/25/24 0212 8     Pain Loc --      Pain Education --      Exclude from Growth Chart --     Most recent vital signs: Vitals:   04/25/24 0400 04/25/24 0430  BP: (!) 138/92 (!) 143/95  Pulse: 70 78  Resp: 14 18  Temp:    SpO2: 100% 96%    General: Awake, no distress.  CV:  Good peripheral perfusion.  Normal heart sounds, regular rate and rhythm. Resp:  Normal effort. Speaking easily and comfortably, no accessory muscle usage nor intercostal retractions.  Lungs are clear to auscultation bilaterally. Abd:  No  distention.    ED Results / Procedures / Treatments   Labs (all labs ordered are listed, but only abnormal results are displayed) Labs Reviewed  BASIC METABOLIC PANEL WITH GFR - Abnormal; Notable for the following components:      Result Value   Glucose, Bld 181 (*)    All other components within normal limits  CBC - Abnormal; Notable for the following components:   WBC 3.7 (*)    All other components within normal limits  URINE DRUG SCREEN  TROPONIN T, HIGH SENSITIVITY  TROPONIN T, HIGH SENSITIVITY     EKG  ED ECG REPORT I, Darleene Dome, the attending physician, personally viewed and interpreted this ECG.  Date: 04/25/2024 EKG Time: 2:14 AM Rate: 82 Rhythm: normal sinus rhythm QRS Axis: normal Intervals: normal ST/T Wave abnormalities: Significant ST segment elevation in lead V2, but only in V2 with no reciprocal changes or changes in adjacent leads. Narrative Interpretation: Worrisome but nondiagnostic for STEMI  ED  ECG REPORT I, Darleene Dome, the attending physician, personally viewed and interpreted this ECG.  Date: 04/25/2024 EKG Time: 2:25 AM Rate: 75 Rhythm: normal sinus rhythm QRS Axis: normal Intervals: normal ST/T Wave abnormalities: Non-specific ST segment / T-wave changes, but no clear evidence of acute ischemia. Narrative Interpretation: no definitive evidence of acute ischemia; does not meet STEMI criteria.    PROCEDURES:  Critical Care performed: No  .1-3 Lead EKG Interpretation  Performed by: Dome Darleene, MD Authorized by: Dome Darleene, MD     Interpretation: normal     ECG rate:  80   ECG rate assessment: normal     Rhythm: sinus rhythm     Ectopy: none     Conduction: normal       IMPRESSION / MDM / ASSESSMENT AND PLAN / ED COURSE  I reviewed the triage vital signs and the nursing notes.                              Differential diagnosis includes, but is not limited to, Prinzmetal's angina, ACS, anxiety, electrolyte  abnormality.  Patient's presentation is most consistent with acute presentation with potential threat to life or bodily function.  Labs/studies ordered: BMP, CBC, high-sensitivity troponin, EKG x 2  Interventions/Medications given:  Medications  aspirin chewable tablet 324 mg (324 mg Oral Given 04/25/24 0328)    (Note:  hospital course my include additional interventions and/or labs/studies not listed above.)   Initial EKG was worrisome but with ST segment elevation only in V2.  However the repeat was reassuring with no clear evidence of ischemia.  I believe the patient may be having a degree of coronary artery vasospasm but currently without evidence of STEMI.  I ordered a full dose aspirin and we will monitor carefully but no indication for emergent cardiology consult at this time.  Labs pending.  The patient is on the cardiac monitor to evaluate for evidence of arrhythmia and/or significant heart rate changes.  Of note, the patient was also saying that he is having suicidal thoughts.  His main concern (as minus) right now is the chest pain but I will reassess after we have ruled out ACS.   Clinical Course as of 04/25/24 9488  Sat Apr 25, 2024  0430 The patient now says that the chest pain is gone.  However he says he is still depressed and having bad evil thoughts.  When I ask him what were his thoughts, initially he would not say, but eventually said he is having thoughts of killing other people.  He told triage that he was having suicidal thoughts.  I think there may be secondary gain given his homeless situation and ongoing issues with cocaine abuse, but I ask him if he wants to see psychiatry and he said that he does so once he has had a negative second troponin, we will consult psychiatry. [CF]  0509 Troponin T High Sensitivity: <15 Repeat troponin negative.  Patient pain-free.  He is medically cleared for psychiatric evaluation. [CF]  0509 The patient has been placed in  psychiatric observation due to the need to provide a safe environment for the patient while obtaining psychiatric consultation and evaluation, as well as ongoing medical and medication management to treat the patient's condition.  The patient has not been placed under full IVC at this time.   [CF]    Clinical Course User Index [CF] Dome Darleene, MD     FINAL CLINICAL  IMPRESSION(S) / ED DIAGNOSES   Final diagnoses:  Chest pain, unspecified type  Cocaine abuse (HCC)  Depression, unspecified depression type  Suicidal thoughts  Homicidal thoughts     Rx / DC Orders   ED Discharge Orders     None        Note:  This document was prepared using Dragon voice recognition software and may include unintentional dictation errors.   Gordan Huxley, MD 04/25/24 [phone]

## 2024-04-25 NOTE — Group Note (Signed)
 LCSW Group Therapy Note  Group Date: 04/25/2024 Start Time: 1100 End Time: 1200   Type of Therapy and Topic:  Group Therapy: Positive Affirmations  Participation Level:  Did Not Attend   Description of Group:   This group addressed positive affirmation towards self and others.  Patients went around the room and identified two positive things about themselves and two positive things about a peer in the room.  Patients reflected on how it felt to share something positive with others, to identify positive things about themselves, and to hear positive things from others/ Patients were encouraged to have a daily reflection of positive characteristics or circumstances.   Therapeutic Goals: Patients will verbalize two of their positive qualities Patients will demonstrate empathy for others by stating two positive qualities about a peer in the group Patients will verbalize their feelings when voicing positive self affirmations and when voicing positive affirmations of others Patients will discuss the potential positive impact on their wellness/recovery of focusing on positive traits of self and others.  Summary of Patient Progress:  Patient did not attend.   Therapeutic Modalities:   Cognitive Behavioral Therapy Motivational Interviewing    Kenneth Yang, Kenneth Yang 04/25/2024  1:52 PM

## 2024-04-25 NOTE — ED Triage Notes (Addendum)
 Patient brought in via [facility] from homeless shelter tonight. Patient complaints of chest pain under breast. Patient also admits crack and alcohol use, states he feels like he's ruined his life and needs help. Admits to SI, no intention, no active plan at this time. Due to EKG patient moved emergently to room 18 for Gordan, MD to evaluate.

## 2024-04-25 NOTE — Plan of Care (Signed)

## 2024-04-25 NOTE — Group Note (Signed)
 Date:  04/25/2024 Time:  5:27 PM  Group Topic/Focus:  Wellness Toolbox:   The focus of this group is to discuss various aspects of wellness, balancing those aspects and exploring ways to increase the ability to experience wellness.  Patients will create a wellness toolbox for use upon discharge.    Participation Level:  Did Not Attend   Deitra Clap [facility] 04/25/2024, 5:27 PM

## 2024-04-25 NOTE — Consult Note (Signed)
 [facility] Psychiatric Consult Initial  Patient Name: .Kenneth Yang  MRN: [id number]  DOB: 11-Jun-1966  Consult Order details:  Orders (From admission, onward)     Start     Ordered   04/25/24 0511  CONSULT TO CALL ACT TEAM       Ordering Provider: Gordan Huxley, MD  Provider:  (Not yet assigned)  Question:  Reason for Consult?  Answer:  Psych consult   04/25/24 0510   04/25/24 0511  IP CONSULT TO PSYCHIATRY       Ordering Provider: Gordan Huxley, MD  Provider:  (Not yet assigned)  Question Answer Comment  Reason for consult: Other (see comments)   Comments: psych consult      04/25/24 0510             Mode of Visit: In person    Psychiatry Consult Evaluation  Service Date: April 25, 2024 LOS:  LOS: 0 days  Chief Complaint Suicidal Ideations  Primary Psychiatric Diagnoses  Suicidal Ideations 2.  Major Depressive Disorder 3.  Cocaine Use Disorder  Assessment  [name] is a 58 y.o. male admitted: Presented to the ED for 04/25/2024  2:19 AM for endorsing chest pain after abusing an unknown amount of cocaine. Patient began reporting suicidal ideations and dark thoughts while being medically evaluated in the ED.   Per ED physician note: [name] is a 58 y.o. male who denies a history of cardiac disease but reports ongoing cocaine abuse.  He presents tonight by EMS from the homeless shelter.  He states that he is having chest pain that is in the lower part of his chest right along the line of his diaphragm.  He said he most recently used cocaine tonight and has been using it fairly constantly for the last 2 months.  No shortness of breath.  No numbness or weakness in his arms or his legs.   Initial triage EKG had a worrisome ST segment elevation only in lead V2, so I had him brought to a room emergently for repeat EKG and rapid evaluation.  Diagnoses:  Active Hospital problems: Active Problems:   Suicidal ideations   Major depressive  disorder   Cocaine use disorder   Plan   ## Psychiatric Medication Recommendations:  - admit patient to inpatient adult BH bed if available and initiate Olanzapine 5 mg twice daily and Fluoxetine  20 mg daily.   ## Medical Decision Making Capacity: Not specifically addressed in this encounter  ## Further Work-up:  -- Pertinent labwork reviewed earlier this admission includes: CBC, CMP, UDS, BAC, and EKG.   ## Disposition:-- We recommend inpatient psychiatric hospitalization when medically cleared. Patient is under voluntary admission status at this time; please IVC if attempts to leave hospital.  ## Behavioral / Environmental: -Patient would benefit from more frequent contact with medical team to delineate plan of care and allow for clarification questions, which will help alleviate anxiety regarding treatment. If possible, try to check back in with the pt in the afternoon.    ## Safety and Observation Level:  - Based on my clinical evaluation, I estimate the patient to be at low risk of self harm in the current setting. - At this time, we recommend  routine. This decision is based on my review of the chart including patient's history and current presentation, interview of the patient, mental status examination, and consideration of suicide risk including evaluating suicidal ideation, plan, intent, suicidal or self-harm behaviors, risk factors, and protective factors.  This judgment is based on our ability to directly address suicide risk, implement suicide prevention strategies, and develop a safety plan while the patient is in the clinical setting. Please contact our team if there is a concern that risk level has changed.  CSSR Risk Category:C-SSRS RISK CATEGORY: High Risk  Suicide Risk Assessment: Patient has following modifiable risk factors for suicide: active suicidal ideation, untreated depression, social isolation, recklessness, lack of access to outpatient mental health resources,  and triggering events, which we are addressing by utilizing therapeutic conversation with patient about events leading up to ED presentation.  Patient has following non-modifiable or demographic risk factors for suicide: male gender and psychiatric hospitalization Patient has the following protective factors against suicide: no history of suicide attempts  Thank you for this consult request. Recommendations have been communicated to the primary team. We will sign off at this time.   [name], NP       History of Present Illness  Relevant Aspects of Hospital ED Course: Admitted on 04/25/2024 initially for chest pain but began endorsing suicidal ideations.   Patient is a 58 year old, single, African-American male who is seen today for a psychiatric consultation after presenting to the ED via EMS with complaints of chest pain. He had been engaging in apparent us  of crack cocaine and currently residing at a local homeless shelter after losing his home and car. Patient relates that he has had severe depression; untreated and currently endorsing suicidal ideations. Upon completing the psychiatric evaluation today, patient reports, I am tired of living; life is too much for me. Patient report that he has presented to multiple facilities for help and is unable to get any help for substance use. He states he will kill himself if he does not receive assistance with substance abuse.   Psych ROS:  Depression: Current and previous episodes Anxiety: Current and previous episodes Mania (lifetime and current): Current and previous episodes Psychosis: (lifetime and current): Denies  Collateral information:  Patient desired for no collateral to be collected from his daughter.   ROS   Psychiatric and Social History  Psychiatric History:  Information collected from patient and patient's chart.  Prev Dx/Sx: Cocaine use disorder, Major depressive disorder Current Psych Provider: Denies Home Meds  (current): Denies Previous Med Trials: Denies Therapy: Denies  Prior Psych Hospitalization: [facility], [facility],   Prior Self Harm: Denies  Prior Violence: Denies  Family Psych History: Denies  Family Hx suicide: Denies  Social History:  Educational Hx: Geneticist, Molecular Occupational Hx: Unemployed Legal Hx: Denies Living Situation: Homeless Spiritual Hx: Christian Access to weapons/lethal means: Denies   Substance History Alcohol: Denies  Type of alcohol Denies Last Drink Denies History of alcohol withdrawal seizures Denies History of DT's Denies Tobacco: approximately 1/2 to 1 pack every other day Illicit drugs: Crack cocaine Prescription drug abuse: Denies Rehab hx: Attempted but not extensively  Exam Findings  Physical Exam: Deferred to EDP- note reviewed   Vital Signs:  Temp:  [98.1 F (36.7 C)] 98.1 F (36.7 C) (11/15 0217) Pulse Rate:  [65-79] 78 (11/15 0430) Resp:  [9-18] 18 (11/15 0430) BP: (138-151)/(85-95) 143/95 (11/15 0430) SpO2:  [96 %-100 %] 96 % (11/15 0430) Weight:  [91 kg] 91 kg (11/15 0212) Blood pressure (!) 143/95, pulse 78, temperature 98.1 F (36.7 C), temperature source Oral, resp. rate 18, height 5' 8 (1.727 m), weight 91 kg, SpO2 96%. Body mass index is 30.5 kg/m.  Physical Exam  Mental Status Exam: General  Appearance: Disheveled and Guarded  Orientation:  Full (Time, Place, and Person)  Memory:  Immediate;   Good Recent;   Good Remote;   Good  Concentration:  Concentration: Fair and Attention Span: Fair  Recall:  Good  Attention  Fair  Eye Contact:  Fair  Speech:  Clear and Coherent and Normal Rate  Language:  Good  Volume:  Normal  Mood: Agitated  Affect:  Constricted  Thought Process:  Coherent, Goal Directed, and Linear  Thought Content:  Logical  Suicidal Thoughts:  No  Homicidal Thoughts:  No  Judgement:  Poor  Insight:  Shallow  Psychomotor Activity:  Normal  Akathisia:  No  Fund of Knowledge:  Fair       Assets:  Communication Skills Desire for Improvement Physical Health  Cognition:  WNL  ADL's:  Intact  AIMS (if indicated):        Other History   These have been pulled in through the EMR, reviewed, and updated if appropriate.  Family History:  The patient's family history is not on file.  Medical History: History reviewed. No pertinent past medical history.  Surgical History: History reviewed. No pertinent surgical history.   Medications:  No current facility-administered medications for this encounter. No current outpatient medications on file.  Allergies: Allergies  Allergen Reactions   Risperidone Other (See Comments)    Tongue EPS, drooling    [name], NP

## 2024-04-25 NOTE — ED Notes (Signed)
 Patient complaining of still feeling hungry after breakfast tray and feeling dehydrated. Given two cups of water and cereal.

## 2024-04-25 NOTE — ED Notes (Signed)
 Did initial EKG in triage, took EKG to Dr. Gordan was informed to repeat EKG in the room. RN Nathanael, NT Ariel, and this tech all got pt on monitor and into a gown to repeat EKG.

## 2024-04-26 DIAGNOSIS — F1994 Other psychoactive substance use, unspecified with psychoactive substance-induced mood disorder: Principal | ICD-10-CM

## 2024-04-26 DIAGNOSIS — R45851 Suicidal ideations: Secondary | ICD-10-CM

## 2024-04-26 LAB — ETHANOL: Alcohol, Ethyl (B): <15 mg/dL (ref <15)

## 2024-04-26 NOTE — [facility] Counselor (Signed)
 CSW attempted to complete assessment with pt, pt reports his stomach is hurting and he would like to do the assessment later.   Kenneth Yang, MSW, [place] 04/26/2024 9:50 AM

## 2024-04-26 NOTE — [facility] Counselor (Signed)
 Adult Comprehensive Assessment  Patient ID: Kenneth Yang, male   DOB: 03/03/1966, 58 y.o.   MRN: [id number]  Information Source: Information source: Patient  Current Stressors:  Patient states their primary concerns and needs for treatment are:: Pt states, I was deeply depressed, and I was making bad choice, I can't stop using drugs, I can't stop Patient states their goals for this hospitilization and ongoing recovery are:: I want to be clean and sober and I want to go back to being employed Educational / Learning stressors: None reported Employment / Job issues: Pt reports he is unemployed Family Relationships: they don't help me, they don't give a damn, I got daughters, they don't give me money, just 10 fucking Chief Executive Officer / Lack of resources (include bankruptcy): I lost the car, I lost the fucking BMW, I don't have shit, I don't have nothing Housing / Lack of housing: Pt reports he is homeless Physical health (include injuries & life threatening diseases): None reported Social relationships: I don't have friends Substance abuse: Pt reports alcohol, crack cocaine, marijuana Bereavement / Loss: Pt reports he lost his best friend, who died in the ICU  Living/Environment/Situation:  Living Arrangements: Alone Living conditions (as described by patient or guardian): Pt reports he is homeless Who else lives in the home?: Pt reports he lives alone How long has patient lived in current situation?: Pt reports he's been homeless since September 10th What is atmosphere in current home: Chaotic  Family History:  Marital status: Single Are you sexually active?: No What is your sexual orientation?: straight Has your sexual activity been affected by drugs, alcohol, medication, or emotional stress?: no Does patient have children?: Yes How many children?: 2 How is patient's relationship with their children?: Pt reports he has two older daughters, one is Kenneth Yang and one in  Kenneth Yang. Pt reports both his children's mothers are deceased  Childhood History:  By whom was/is the patient raised?: Both parents Additional childhood history information: Pt  does not report Description of patient's relationship with caregiver when they were a child: it was good Patient's description of current relationship with people who raised him/her: Pt reports his parents are deceased How were you disciplined when you got in trouble as a child/adolescent?: I got spankings, Does patient have siblings?: Yes Number of Siblings: 3 Description of patient's current relationship with siblings: its all right: Did patient suffer any verbal/emotional/physical/sexual abuse as a child?: No Did patient suffer from severe childhood neglect?: No Has patient ever been sexually abused/assaulted/raped as an adolescent or adult?: No Was the patient ever a victim of a crime or a disaster?: No Witnessed domestic violence?: No Has patient been affected by domestic violence as an adult?: No  Education:  Highest grade of school patient has completed: some college Currently a consulting civil engineer?: No Learning disability?: No  Employment/Work Situation:   Employment Situation: Unemployed Patient's Job has Been Impacted by Current Illness: Yes Describe how Patient's Job has Been Impacted: Pt does not report What is the Longest Time Patient has Held a Job?: couple of years 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?: No  Financial Resources:   Financial resources: No income Does patient have a lawyer or guardian?: No  Alcohol/Substance Abuse:   What has been your use of drugs/alcohol within the last 12 months?: alcohol, crack cocaine, marijuana If attempted suicide, did drugs/alcohol play a role in this?: Yes Alcohol/Substance Abuse Treatment Hx: Past Tx, Inpatient  If yes, describe treatment: long time ago, I don't  remember Has alcohol/substance abuse ever caused legal problems?: No (Pt reports he has some charges for stealing in Febuary 2026)  Social Support System:   Patient's Community Support System: None Describe Community Support System: Pt reports none Type of faith/religion: No How does patient's faith help to cope with current illness?: n/a  Leisure/Recreation:   Do You Have Hobbies?: No  Strengths/Needs:   What is the patient's perception of their strengths?: I don't know Patient states they can use these personal strengths during their treatment to contribute to their recovery: Pt does not report Patient states these barriers may affect/interfere with their treatment: None reported Patient states these barriers may affect their return to the community: None reported Other important information patient would like considered in planning for their treatment: None reported  Discharge Plan:   Currently receiving community mental health services: No Patient states concerns and preferences for aftercare planning are: Pt reports he would like in-patient rehab Patient states they will know when they are safe and ready for discharge when: I have to think about it Does patient have access to transportation?: No Does patient have financial barriers related to discharge medications?: Yes Patient description of barriers related to discharge medications: Pt reports no insurance Plan for no access to transportation at discharge: CSW to assist with transportation Plan for living situation after discharge: Pt would like to go to in-patient rehab. Will patient be returning to same living situation after discharge?: No  Summary/Recommendations:   Summary and Recommendations (to be completed by the evaluator): Patient is a 58 year-old male from [place], [place] [facility]). According ED notes, [facility] from homeless shelter tonight. Patient complaints of chest pain under breast. Patient also admits crack  and alcohol use, states he feels like he's ruined his life and needs help. Admits to SI, no intention, no active plan at this time. Due to EKG patient moved emergently to room 18 for Gordan, MD to evaluate., Upon assessment pt reports multiple psychosocial stressors stating that he lost everything. 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 Pt reports that he would like in-patient rehab. Pt's primary diagnosis is suicidal ideation. Recommendations include: crisis stabilization, therapeutic milieu, encourage group attendance and participation, medication management for mood stabilization and development of comprehensive mental wellness/sobriety plan.  Lum JONETTA Croft. 04/26/2024

## 2024-04-26 NOTE — [facility] Suicide Risk Assessment (Signed)
 [facility] INPATIENT:  Family/Significant Other Suicide Prevention Education  Suicide Prevention Education:  Patient Refusal for Family/Significant Other Suicide Prevention Education: The patient Kenneth Yang has refused to provide written consent for family/significant other to be provided Family/Significant Other Suicide Prevention Education during admission and/or prior to discharge.  Physician notified.  Lum JONETTA Croft 04/26/2024, 11:55 AM

## 2024-04-26 NOTE — Plan of Care (Signed)
   Problem: Education: Goal: Emotional status will improve Outcome: Progressing Goal: Mental status will improve Outcome: Progressing   Problem: Activity: Goal: Interest or engagement in activities will improve Outcome: Progressing

## 2024-04-26 NOTE — [facility] Suicide Risk Assessment (Signed)
 [facility] Admission Suicide Risk Assessment   Nursing information obtained from:  Patient Demographic factors:  Male, Low socioeconomic status, Living alone, Unemployed Current Mental Status:  Suicidal ideation indicated by patient Loss Factors:  Decrease in vocational status, Decline in physical health, Financial problems / change in socioeconomic status Historical Factors:  NA Risk Reduction Factors:  NA  Total Time spent with patient: 1 hour Principal Problem: Substance induced mood disorder (HCC) Diagnosis:  Principal Problem:   Substance induced mood disorder (HCC) Active Problems:   Suicidal ideations  Subjective Data:   Patient is a 58 year old male with a pphx of cocaine use disorder, cocaine induced psychosis, and polysubstance abuse who presented from homeless shelter to ED for chest pain and endorsed SI. Patient UDS positive for cocaine during ED presentation.   Patient reports ongoing crack cocaine use since early September 2025. Reports chronic alcohol use indication a lot and would not specify further. Reports using other drugs but did not want to discuss further. Patient expresses frustration with assessments during admission. Patient is irritable with clinical research associate and minimally engages. Patient denies current SI, HI, and AVH. Reports intermittent dark thoughts and being in a dark place indicating suicidal ideations.   Continued Clinical Symptoms:    The Alcohol Use Disorders Identification Test, Guidelines for Use in Primary Care, Second Edition.  [employer] [facility]). Score between 0-7:  no or low risk or alcohol related problems. Score between 8-15:  moderate risk of alcohol related problems. Score between 16-19:  high risk of alcohol related problems. Score 20 or above:  warrants further diagnostic evaluation for alcohol dependence and treatment.   CLINICAL FACTORS:   Depression:   Anhedonia Comorbid alcohol abuse/dependence Alcohol/Substance  Abuse/Dependencies More than one psychiatric diagnosis Previous Psychiatric Diagnoses and Treatments   Musculoskeletal: Strength & Muscle Tone: within normal limits Gait & Station: normal Patient leans: N/A  Psychiatric Specialty Exam:  Presentation  General Appearance:  Appropriate for Environment  Eye Contact: Fair  Speech: Clear and Coherent; Normal Rate  Speech Volume: Normal  Handedness:No data recorded  Mood and Affect  Mood: Irritable  Affect: Congruent   Thought Process  Thought Processes: Coherent  Descriptions of Associations:Intact  Orientation:Full (Time, Place and Person)  Thought Content:WDL  History of Schizophrenia/Schizoaffective disorder:No data recorded Duration of Psychotic Symptoms:No data recorded Hallucinations:Hallucinations: None  Ideas of Reference:None  Suicidal Thoughts:Suicidal Thoughts: No  Homicidal Thoughts:Homicidal Thoughts: No   Sensorium  Memory: Immediate Fair; Recent Fair; Remote Fair  Judgment: Poor  Insight: Poor   Executive Functions  Concentration: Fair  Attention Span: Fair  Recall: [employer] of Knowledge: Fair  Language: Fair   Psychomotor Activity  Psychomotor Activity: Psychomotor Activity: Normal   Assets  Assets: Communication Skills   Sleep  Sleep: Sleep: Fair    Physical Exam: Physical Exam Vitals and nursing note reviewed.  Constitutional:      Appearance: Normal appearance.  Pulmonary:     Effort: Pulmonary effort is normal.  Neurological:     Mental Status: He is alert and oriented to person, place, and time.  Psychiatric:        Behavior: Behavior normal.    Review of Systems  Respiratory:  Negative for shortness of breath.   Cardiovascular:  Negative for chest pain.  Gastrointestinal:  Negative for diarrhea, nausea and vomiting.  Psychiatric/Behavioral:  Positive for depression and substance abuse. Negative for hallucinations and suicidal ideas.    All other systems reviewed and are negative.  Blood pressure ROLLEN)  155/86, pulse 87, temperature 98.6 F (37 C), temperature source Oral, resp. rate 17, height 5' 8 (1.727 m), weight 87.1 kg, SpO2 97%. Body mass index is 29.19 kg/m.   COGNITIVE FEATURES THAT CONTRIBUTE TO RISK:  None    SUICIDE RISK:   Moderate:  Frequent suicidal ideation with limited intensity, and duration, some specificity in terms of plans, no associated intent, good self-control, limited dysphoria/symptomatology, some risk factors present, and identifiable protective factors, including available and accessible social support.  PLAN OF CARE: Admission to inpatient   I certify that inpatient services furnished can reasonably be expected to improve the patient's condition.   Adith Tejada, NP 04/26/2024, 12:47 PM

## 2024-04-26 NOTE — H&P (Signed)
 Psychiatric Admission Assessment Adult  Patient Identification: Kenneth Yang MRN:  [id number] Date of Evaluation:  04/26/2024 Chief Complaint:  Suicidal ideations [R45.851]   History of Present Illness:   Darrio Bade is a 58 year old male with a pphx of cocaine use disorder, cocaine induced psychosis, and polysubstance abuse who presented from homeless shelter to ED for chest pain and endorsed SI. Patient UDS positive for cocaine during ED presentation. BAL 0.   Patient reports ongoing crack cocaine use since early September 2025. Reports chronic alcohol use indication a lot and would not specify further. Reports using other drugs but did not want to discuss further. Patient expresses frustration with assessments during admission. Patient is irritable with clinical research associate and minimally engages. Patient denies current SI, HI, and AVH. Reports intermittent dark thoughts and being in a dark place indicating suicidal ideations. He reports having nothing indicating no friends, no family.   Patient reports having participated in many rehab programs, but is unable to recall most recent or names. Denies having outpatient psychiatric care or being on any medications (psychiatric or medical) outside of the hospital. Patient has been declining medications on the unit - including Librium withdrawal protocol due to patient's elusive report of chronic alcohol use.   Patient minimally engaged with admission process and chart reviewed for further information. Patient with several previous hospital ED visits related to substance use.   Of note, during chart review, patient seen at [facility] for medical clearance to go [facility] on 04/10/2024  Total Time spent with patient: 1 hour Sleep  Sleep:Sleep: Fair  Past Psychiatric History: Polysubstance abuse, Cocaine abuse, Cocaine induced psychotic disorder with hallucinations, Alcohol use disorder, Depression Psychiatric History:   Information collected from patient and chart review  Prev Dx/Sx: Polysubstance abuse, Cocaine abuse, Cocaine induced psychotic disorder with hallucinations, Alcohol use disorder, Depression Current Psych Provider: None Home Meds (current): None Previous Med Trials: Patient unsure (chart review shows Prozac  and Abilify  in 2022 while hospitalized) Therapy: Denies  Prior Psych Hospitalization: [facility] 01/2021 (patient reports several) Prior Self Harm: Patient endorses, does not specify further Prior Violence: Patient denies  Family Psych History: Endorses mother - unsure of diagnosis Family Hx suicide: Denies  Social History:  Developmental Hx: Declines to discuss Educational Hx: High school Occupational Hx: Reports previously working with [employer] Legal Hx: Denies Living Situation: Homeless Spiritual Hx: Denies Access to weapons/lethal means: Denies  Substance History Alcohol: Endorses Type of alcohol all Last Drink Unsure Number of drinks per day a lot History of alcohol withdrawal seizures denies History of DT's denies Tobacco: Yes, Vapes Illicit drugs: Cocaine (chart shows history of opioids) Prescription drug abuse: Denies Rehab hx: a lot of them Is the patient at risk to self? Yes.    Has the patient been a risk to self in the past 6 months? Yes.    Has the patient been a risk to self within the distant past? Yes.    Is the patient a risk to others? No.  Has the patient been a risk to others in the past 6 months? No.  Has the patient been a risk to others within the distant past? No.   Columbia Scale:  Flowsheet Row Admission (Current) from 04/25/2024 in [facility] INPATIENT BEHAVIORAL MEDICINE Most recent reading at 04/25/2024  3:00 PM ED from 04/25/2024 in [facility] Emergency Department at [facility] Most recent reading at 04/25/2024  8:10 AM ED from 03/26/2024 in [facility] Emergency Department at [facility] Most recent  reading at 03/26/2024  6:43 PM   C-SSRS RISK CATEGORY High Risk High Risk No Risk     Past Medical History: History reviewed. No pertinent past medical history. History reviewed. No pertinent surgical history. Family History: History reviewed. No pertinent family history.  Social History:  Social History   Substance and Sexual Activity  Alcohol Use Yes   Alcohol/week: 114.0 standard drinks of alcohol   Types: 84 Cans of beer, 30 Shots of liquor per week   Comment: a pint of liquor and 12-24 pack of beer     Social History   Substance and Sexual Activity  Drug Use Yes   Types: Methamphetamines, Marijuana, Crack cocaine, Cocaine      Allergies:   Allergies  Allergen Reactions   Risperidone Other (See Comments)    Tongue EPS, drooling   Lab Results:  Results for orders placed or performed during the hospital encounter of 04/25/24 (from the past 48 hours)  Ethanol     Status: None   Collection Time: 04/26/24  7:39 AM  Result Value Ref Range   Alcohol, Ethyl (B) <15 <15 mg/dL    Comment: (NOTE) For medical purposes only. Performed at [facility], [address] [place], [place] [address]     Blood Alcohol level:  Lab Results  Component Value Date   [facility] <15 04/26/2024   ETH <10 06/17/2021    Metabolic Disorder Labs:  Lab Results  Component Value Date   HGBA1C 6.3 (H) 02/04/2021   MPG 134.11 02/04/2021   No results found for: PROLACTIN Lab Results  Component Value Date   CHOL 145 02/04/2021   TRIG 98 02/04/2021   HDL 55 02/04/2021   CHOLHDL 2.6 02/04/2021   VLDL 20 02/04/2021   LDLCALC 70 02/04/2021    Current Medications: Current Facility-Administered Medications  Medication Dose Route Frequency Provider Last Rate Last Admin   acetaminophen  (TYLENOL ) tablet 650 mg  650 mg Oral Q6H PRN Montague, Crystal J, NP       alum & mag hydroxide-simeth (MAALOX/MYLANTA) 200-200-20 MG/5ML suspension 30 mL  30 mL Oral Q4H PRN Montague, Crystal J, NP       alum & mag  hydroxide-simeth (MAALOX/MYLANTA) 200-200-20 MG/5ML suspension 30 mL  30 mL Oral Q4H PRN Montague, Crystal J, NP       chlordiazePOXIDE (LIBRIUM) capsule 25 mg  25 mg Oral Q6H PRN Remer Couse, NP       chlordiazePOXIDE (LIBRIUM) capsule 25 mg  25 mg Oral QID Leandrew Keech, NP       Followed by   NOREEN ON 04/27/2024] chlordiazePOXIDE (LIBRIUM) capsule 25 mg  25 mg Oral TID Tommy Goostree, NP       Followed by   NOREEN ON 04/28/2024] chlordiazePOXIDE (LIBRIUM) capsule 25 mg  25 mg Oral BH-qamhs Welborn Keena, NP       Followed by   NOREEN ON 04/29/2024] chlordiazePOXIDE (LIBRIUM) capsule 25 mg  25 mg Oral Daily Summerlyn Fickel, NP       haloperidol  (HALDOL ) tablet 5 mg  5 mg Oral TID PRN Montague, Crystal J, NP       And   diphenhydrAMINE  (BENADRYL ) capsule 50 mg  50 mg Oral TID PRN Montague, Crystal J, NP       haloperidol  lactate (HALDOL ) injection 5 mg  5 mg Intramuscular TID PRN Montague, Crystal J, NP       And   diphenhydrAMINE  (BENADRYL ) injection 50 mg  50 mg Intramuscular TID PRN Montague, Crystal J,  NP       And   LORazepam  (ATIVAN ) injection 2 mg  2 mg Intramuscular TID PRN Montague, Crystal J, NP       haloperidol  lactate (HALDOL ) injection 10 mg  10 mg Intramuscular TID PRN Montague, Crystal J, NP       And   diphenhydrAMINE  (BENADRYL ) injection 50 mg  50 mg Intramuscular TID PRN Montague, Crystal J, NP       And   LORazepam  (ATIVAN ) injection 2 mg  2 mg Intramuscular TID PRN Montague, Crystal J, NP       hydrOXYzine  (ATARAX ) tablet 25 mg  25 mg Oral TID PRN Montague, Crystal J, NP       loperamide (IMODIUM) capsule 2-4 mg  2-4 mg Oral PRN Albie Arizpe, NP       magnesium  hydroxide (MILK OF MAGNESIA) suspension 30 mL  30 mL Oral Daily PRN Montague, Crystal J, NP       multivitamin with minerals tablet 1 tablet  1 tablet Oral Daily Jaques Mineer, NP   1 tablet at 04/25/24 1717   nicotine (NICODERM CQ - dosed in mg/24 hours) patch 21 mg  21 mg Transdermal Q0600 Montague, Crystal J, NP        ondansetron (ZOFRAN-ODT) disintegrating tablet 4 mg  4 mg Oral Q6H PRN Sharnee Douglass, NP       thiamine (VITAMIN B1) tablet 100 mg  100 mg Oral Daily Avner Stroder, NP       traZODone  (DESYREL ) tablet 50 mg  50 mg Oral QHS PRN Montague, Crystal J, NP       PTA Medications: No medications prior to admission.    Psychiatric Specialty Exam:  Presentation  General Appearance:  Appropriate for Environment  Eye Contact: Fair  Speech: Clear and Coherent; Normal Rate  Speech Volume: Normal    Mood and Affect  Mood: Irritable  Affect: Congruent   Thought Process  Thought Processes: Coherent  Descriptions of Associations:Intact  Orientation:Full (Time, Place and Person)  Thought Content:WDL  Hallucinations:Hallucinations: None  Ideas of Reference:None  Suicidal Thoughts:Suicidal Thoughts: No  Homicidal Thoughts:Homicidal Thoughts: No   Sensorium  Memory: Immediate Fair; Recent Fair; Remote Fair  Judgment: Poor  Insight: Poor   Executive Functions  Concentration: Fair  Attention Span: Fair  Recall: [employer] of Knowledge: Fair  Language: Fair   Psychomotor Activity  Psychomotor Activity: Psychomotor Activity: Normal   Assets  Assets: Communication Skills    Musculoskeletal: Strength & Muscle Tone: within normal limits Gait & Station: normal  Physical Exam: Physical Exam Vitals and nursing note reviewed.  Constitutional:      Appearance: Normal appearance.  Pulmonary:     Effort: Pulmonary effort is normal.  Neurological:     Mental Status: He is alert and oriented to person, place, and time.  Psychiatric:        Behavior: Behavior normal.    Review of Systems  Respiratory:  Negative for shortness of breath.   Cardiovascular:  Negative for chest pain.  Gastrointestinal:  Negative for diarrhea, nausea and vomiting.  Psychiatric/Behavioral:  Positive for depression and substance abuse. Negative for hallucinations and  suicidal ideas.    Blood pressure (!) 155/86, pulse 87, temperature 98.6 F (37 C), temperature source Oral, resp. rate 17, height 5' 8 (1.727 m), weight 87.1 kg, SpO2 97%. Body mass index is 29.19 kg/m.  Principal Diagnosis: Substance induced mood disorder (HCC) Diagnosis:  Principal Problem:   Substance induced mood disorder (HCC) Active Problems:  Suicidal ideations   Clinical Decision Making:  Treatment Plan Summary:  Safety and Monitoring:             -- Voluntary admission to inpatient psychiatric unit for safety, stabilization and treatment             -- Daily contact with patient to assess and evaluate symptoms and progress in treatment             -- Patient's case to be discussed in multi-disciplinary team meeting             -- Observation Level: q15 minute checks             -- Vital signs:  q12 hours             -- Precautions: suicide, elopement, and assault   2. Psychiatric Diagnoses and Treatment:  Patient declining all medication at this time Librium withdrawal protocol Can consider Prozac  and Abilify  as this seemed effective during 2022 admission to [facility].      -- The risks/benefits/side-effects/alternatives to this medication were discussed in detail with the patient and time was given for questions. The patient consents to medication trial.                -- Metabolic profile and EKG monitoring obtained while on an atypical antipsychotic (BMI: Lipid Panel: HbgA1c: QTc:)              -- Encouraged patient to participate in unit milieu and in scheduled group therapies                            3. Medical Issues Being Addressed:   No acute issues expressed   4. Discharge Planning:              -- Social work and case management to assist with discharge planning and identification of hospital follow-up needs prior to discharge             -- Estimated LOS: 5-7 days             -- Discharge Concerns: Need to establish a safety plan; Medication compliance  and effectiveness             -- Discharge Goals: Rehabilitation  Physician Treatment Plan for Primary Diagnosis: Substance induced mood disorder (HCC) Long Term Goal(s): Improvement in symptoms so as ready for discharge  Short Term Goals: Ability to identify changes in lifestyle to reduce recurrence of condition will improve, Ability to identify and develop effective coping behaviors will improve, Ability to maintain clinical measurements within normal limits will improve, Compliance with prescribed medications will improve, and Ability to identify triggers associated with substance abuse/mental health issues will improve  Physician Treatment Plan for Secondary Diagnosis: Principal Problem:   Substance induced mood disorder (HCC) Active Problems:   Suicidal ideations  Long Term Goal(s): Improvement in symptoms so as ready for discharge  Short Term Goals: Ability to identify changes in lifestyle to reduce recurrence of condition will improve, Ability to verbalize feelings will improve, Ability to disclose and discuss suicidal ideas, and Ability to identify and develop effective coping behaviors will improve  I certify that inpatient services furnished can reasonably be expected to improve the patient's condition.    Glenys Beal, NP 11/16/202512:48 PM

## 2024-04-26 NOTE — Group Note (Signed)
 Date:  04/26/2024 Time:  5:27 AM   Additional Comments:  Did not attend group.  Butler LITTIE Gelineau 04/26/2024, 5:27 AM

## 2024-04-26 NOTE — Progress Notes (Signed)
 Patient is refusing all meds. He mostly isolates to his room but does present for meals. Denies SI, HI and AVH   04/26/24 0900  Psych Admission Type (Psych Patients Only)  Admission Status Voluntary  Psychosocial Assessment  Patient Complaints Anxiety;Depression  Eye Contact Avertive  Facial Expression Flat  Affect Blunted  Speech Logical/coherent  Interaction Assertive;Isolative  Motor Activity Slow  Appearance/Hygiene Body odor  Behavior Characteristics Appropriate to situation  Mood Angry  Thought Process  Coherency WDL  Content Blaming others  Delusions None reported or observed  Perception WDL  Hallucination None reported or observed  Judgment WDL  Confusion WDL  Danger to Self  Current suicidal ideation? Denies  Danger to Others  Danger to Others None reported or observed

## 2024-04-26 NOTE — Plan of Care (Signed)
  Problem: Education: Goal: Emotional status will improve Outcome: Progressing   Problem: Education: Goal: Mental status will improve Outcome: Progressing   Problem: Education: Goal: Verbalization of understanding the information provided will improve Outcome: Progressing   

## 2024-04-26 NOTE — Group Note (Signed)
 Date:  04/26/2024 Time:  8:58 PM  Group Topic/Focus:  Wrap-Up Group:   The focus of this group is to help patients review their daily goal of treatment and discuss progress on daily workbooks.    Participation Level:  Did Not Attend  Participation Quality:  none  Affect:  none  Cognitive:  none  Insight: None  Engagement in Group:  none  Modes of Intervention:  none  Additional Comments:  none   Butler LITTIE Gelineau 04/26/2024, 8:58 PM

## 2024-04-26 NOTE — Group Note (Signed)
 Date:  04/26/2024 Time:  5:11 PM  Group Topic/Focus:  Goals Group:   The focus of this group is to help patients establish daily goals to achieve during treatment and discuss how the patient can incorporate goal setting into their daily lives to aide in recovery.    Participation Level:  Did Not Attend  Participation Quality:    Affect:    Cognitive:    Insight:   Engagement in Group:    Modes of Intervention:    Additional Comments:    Bonnielee LOISE Pepper 04/26/2024, 5:11 PM

## 2024-04-26 NOTE — Progress Notes (Addendum)
   04/26/24 0626  Psych Admission Type (Psych Patients Only)  Admission Status Voluntary  Psychosocial Assessment  Patient Complaints Agitation;Anxiety;Decreased concentration;Depression  Eye Contact Avertive  Facial Expression Flat  Affect Blunted  Speech Logical/coherent  Interaction Assertive;Isolative;Guarded  Motor Activity Slow  Appearance/Hygiene Body odor  Behavior Characteristics Appropriate to situation;Cooperative  Mood Angry;Anxious  Thought Process  Coherency WDL  Content Blaming others;Paranoia;Preoccupation  Delusions None reported or observed  Perception WDL  Hallucination None reported or observed  Judgment WDL  Confusion WDL  Danger to Self  Current suicidal ideation? Denies  Danger to Others  Danger to Others None reported or observed   Patient appears irritable and angry, he is assertive and blunted, and isolated to his room. Patient is alert and oriented x 4, affect is congruent with mood, thoughts are linear, he refused to interact with the writer, in addition refused schedule d night time medication. He is on CIWA ; the librium taper / detox but he refused all medication.15 minutes safety check maintained will continue to monitor.

## 2024-04-27 NOTE — Progress Notes (Signed)
   04/27/24 0430  Psych Admission Type (Psych Patients Only)  Admission Status Voluntary  Psychosocial Assessment  Patient Complaints Suspiciousness  Eye Contact Brief  Facial Expression Flat  Affect Blunted  Speech Logical/coherent  Interaction Assertive;Isolative  Motor Activity Slow  Appearance/Hygiene Disheveled  Behavior Characteristics Cooperative  Mood Labile  Thought Process  Coherency WDL  Content WDL  Delusions WDL  Perception WDL  Hallucination None reported or observed  Judgment WDL  Confusion WDL  Danger to Self  Current suicidal ideation? Denies  Danger to Others  Danger to Others None reported or observed   Patient isolates to his room, minimal interaction with staff, affect is blunted, he appears less anxious and irritable, appropriate eye contact,he denies SI/HI/AVH. 15 minutes safety checks maintained will continue to monitor.

## 2024-04-27 NOTE — Group Note (Signed)
 Recreation Therapy Group Note   Group Topic:Health and Wellness  Group Date: 04/27/2024 Start Time: 1015 End Time: 1100 Facilitators: Celestia Jeoffrey BRAVO, LRT, CTRS Location: Courtyard  Group Description: [employer]. LRT and patients played games of basketball, drew with chalk, and played corn hole while outside in the courtyard while getting fresh air and sunlight. Music was being played in the background. LRT and peers conversed about different games they have played before, what they do in their free time and anything else that is on their minds. LRT encouraged pts to drink water after being outside, sweating and getting their heart rate up.  Goal Area(s) Addressed: Patient will build on frustration tolerance skills. Patients will partake in a competitive play game with peers. Patients will gain knowledge of new leisure interest/hobby.    Affect/Mood: N/A   Participation Level: Did not attend    Clinical Observations/Individualized Feedback: Patient did not attend group.   Plan: Continue to engage patient in RT group sessions 2-3x/week.   Jeoffrey BRAVO Celestia, LRT, CTRS 04/27/2024 11:10 AM

## 2024-04-27 NOTE — BH IP Treatment Plan (Signed)
 Interdisciplinary Treatment and Diagnostic Plan Update  04/27/2024 Time of Session: 10:03 AM Kenneth Yang MRN: [id number]  Principal Diagnosis: Substance induced mood disorder (HCC)  Secondary Diagnoses: Principal Problem:   Substance induced mood disorder (HCC) Active Problems:   Suicidal ideations   Current Medications:  Current Facility-Administered Medications  Medication Dose Route Frequency Provider Last Rate Last Admin   acetaminophen  (TYLENOL ) tablet 650 mg  650 mg Oral Q6H PRN Montague, Crystal J, NP       alum & mag hydroxide-simeth (MAALOX/MYLANTA) 200-200-20 MG/5ML suspension 30 mL  30 mL Oral Q4H PRN Montague, Crystal J, NP       alum & mag hydroxide-simeth (MAALOX/MYLANTA) 200-200-20 MG/5ML suspension 30 mL  30 mL Oral Q4H PRN Montague, Crystal J, NP       chlordiazePOXIDE (LIBRIUM) capsule 25 mg  25 mg Oral Q6H PRN May, Tanya, NP       chlordiazePOXIDE (LIBRIUM) capsule 25 mg  25 mg Oral TID May, Tanya, NP       Followed by   NOREEN ON 04/28/2024] chlordiazePOXIDE (LIBRIUM) capsule 25 mg  25 mg Oral BH-qamhs May, Tanya, NP       Followed by   NOREEN ON 04/29/2024] chlordiazePOXIDE (LIBRIUM) capsule 25 mg  25 mg Oral Daily May, Tanya, NP       haloperidol  (HALDOL ) tablet 5 mg  5 mg Oral TID PRN Montague, Crystal J, NP       And   diphenhydrAMINE  (BENADRYL ) capsule 50 mg  50 mg Oral TID PRN Montague, Crystal J, NP       haloperidol  lactate (HALDOL ) injection 5 mg  5 mg Intramuscular TID PRN Montague, Crystal J, NP       And   diphenhydrAMINE  (BENADRYL ) injection 50 mg  50 mg Intramuscular TID PRN Montague, Crystal J, NP       And   LORazepam  (ATIVAN ) injection 2 mg  2 mg Intramuscular TID PRN Montague, Crystal J, NP       haloperidol  lactate (HALDOL ) injection 10 mg  10 mg Intramuscular TID PRN Montague, Crystal J, NP       And   diphenhydrAMINE  (BENADRYL ) injection 50 mg  50 mg Intramuscular TID PRN Sherril Camelia PARAS, NP       And   LORazepam  (ATIVAN )  injection 2 mg  2 mg Intramuscular TID PRN Montague, Crystal J, NP       hydrOXYzine  (ATARAX ) tablet 25 mg  25 mg Oral TID PRN Montague, Crystal J, NP       loperamide (IMODIUM) capsule 2-4 mg  2-4 mg Oral PRN May, Tanya, NP       magnesium  hydroxide (MILK OF MAGNESIA) suspension 30 mL  30 mL Oral Daily PRN Montague, Crystal J, NP       multivitamin with minerals tablet 1 tablet  1 tablet Oral Daily May, Tanya, NP   1 tablet at 04/25/24 1717   nicotine (NICODERM CQ - dosed in mg/24 hours) patch 21 mg  21 mg Transdermal Q0600 Montague, Crystal J, NP       ondansetron (ZOFRAN-ODT) disintegrating tablet 4 mg  4 mg Oral Q6H PRN May, Tanya, NP       thiamine (VITAMIN B1) tablet 100 mg  100 mg Oral Daily May, Tanya, NP       traZODone  (DESYREL ) tablet 50 mg  50 mg Oral QHS PRN Montague, Crystal J, NP       PTA Medications: No medications prior to admission.    Patient  Stressors:    Patient Strengths:    Treatment Modalities: Medication Management, Group therapy, Case management,  1 to 1 session with clinician, Psychoeducation, Recreational therapy.   Physician Treatment Plan for Primary Diagnosis: Substance induced mood disorder (HCC) Long Term Goal(s): Improvement in symptoms so as ready for discharge   Short Term Goals: Ability to identify changes in lifestyle to reduce recurrence of condition will improve Ability to verbalize feelings will improve Ability to disclose and discuss suicidal ideas Ability to identify and develop effective coping behaviors will improve Ability to maintain clinical measurements within normal limits will improve Compliance with prescribed medications will improve Ability to identify triggers associated with substance abuse/mental health issues will improve  Medication Management: Evaluate patient's response, side effects, and tolerance of medication regimen.  Therapeutic Interventions: 1 to 1 sessions, Unit Group sessions and Medication  administration.  Evaluation of Outcomes: Not Met  Physician Treatment Plan for Secondary Diagnosis: Principal Problem:   Substance induced mood disorder (HCC) Active Problems:   Suicidal ideations  Long Term Goal(s): Improvement in symptoms so as ready for discharge   Short Term Goals: Ability to identify changes in lifestyle to reduce recurrence of condition will improve Ability to verbalize feelings will improve Ability to disclose and discuss suicidal ideas Ability to identify and develop effective coping behaviors will improve Ability to maintain clinical measurements within normal limits will improve Compliance with prescribed medications will improve Ability to identify triggers associated with substance abuse/mental health issues will improve     Medication Management: Evaluate patient's response, side effects, and tolerance of medication regimen.  Therapeutic Interventions: 1 to 1 sessions, Unit Group sessions and Medication administration.  Evaluation of Outcomes: Not Met   RN Treatment Plan for Primary Diagnosis: Substance induced mood disorder (HCC) Long Term Goal(s): Knowledge of disease and therapeutic regimen to maintain health will improve  Short Term Goals: Ability to verbalize frustration and anger appropriately will improve, Ability to demonstrate self-control, Ability to participate in decision making will improve, Ability to verbalize feelings will improve, Ability to disclose and discuss suicidal ideas, and Ability to identify and develop effective coping behaviors will improve  Medication Management: RN will administer medications as ordered by provider, will assess and evaluate patient's response and provide education to patient for prescribed medication. RN will report any adverse and/or side effects to prescribing provider.  Therapeutic Interventions: 1 on 1 counseling sessions, Psychoeducation, Medication administration, Evaluate responses to treatment,  Monitor vital signs and CBGs as ordered, Perform/monitor CIWA, COWS, AIMS and Fall Risk screenings as ordered, Perform wound care treatments as ordered.  Evaluation of Outcomes: Not Met   LCSW Treatment Plan for Primary Diagnosis: Substance induced mood disorder (HCC) Long Term Goal(s): Safe transition to appropriate next level of care at discharge, Engage patient in therapeutic group addressing interpersonal concerns.  Short Term Goals: Engage patient in aftercare planning with referrals and resources, Increase social support, Increase ability to appropriately verbalize feelings, Increase emotional regulation, Facilitate acceptance of mental health diagnosis and concerns, Facilitate patient progression through stages of change regarding substance use diagnoses and concerns, Identify triggers associated with mental health/substance abuse issues, and Increase skills for wellness and recovery  Therapeutic Interventions: Assess for all discharge needs, 1 to 1 time with Social worker, Explore available resources and support systems, Assess for adequacy in community support network, Educate family and significant other(s) on suicide prevention, Complete Psychosocial Assessment, Interpersonal group therapy.  Evaluation of Outcomes: Not Met   Progress in Treatment: Attending groups: No. Participating  in groups: No. Taking medication as prescribed: Yes. Toleration medication: Yes. Family/Significant other contact made: No, will contact:  Patient declined family contact. CSW to contact once permission is granted.  Patient understands diagnosis: Yes. Discussing patient identified problems/goals with staff: Yes. Medical problems stabilized or resolved: Yes. Denies suicidal/homicidal ideation: Yes. Issues/concerns per patient self-inventory: No. Other: None  New problem(s) identified: No, Describe:  None  New Short Term/Long Term Goal(s):detox, elimination of symptoms of psychosis, medication  management for mood stabilization; elimination of SI thoughts; development of comprehensive mental wellness/sobriety plan.    Patient Goals: I want to get clean from alcohol and crack cocaine.    Discharge Plan or Barriers: CSW to assist with the development of appropriate discharge plan.    Reason for Continuation of Hospitalization: Aggression Anxiety Depression Suicidal ideation Withdrawal symptoms  Estimated Length of Stay: 1-7 days.   Last 3 [place] Suicide Severity Risk Score: Flowsheet Row Admission (Current) from 04/25/2024 in [facility] Most recent reading at 04/25/2024  3:00 PM ED from 04/25/2024 in [facility] Emergency Department at [facility] Most recent reading at 04/25/2024  8:10 AM ED from 03/26/2024 in [facility] Emergency Department at [facility] Most recent reading at 03/26/2024  6:43 PM  C-SSRS RISK CATEGORY High Risk High Risk No Risk    Last PHQ 2/9 Scores:     No data to display          Scribe for Treatment Team: Alveta CHRISTELLA Kerns, LCSW 04/27/2024 10:09 AM

## 2024-04-27 NOTE — Group Note (Signed)
 Date:  04/27/2024 Time:  7:10 PM  Group Topic/Focus:  Dimensions of Wellness:   The focus of this group is to introduce the topic of wellness and discuss the role each dimension of wellness plays in total health.    Participation Level:  Did Not Attend    Kenneth Yang 04/27/2024, 7:10 PM

## 2024-04-27 NOTE — Plan of Care (Signed)
   Problem: Education: Goal: Knowledge of [facility] Education information/materials will improve Outcome: Progressing   Problem: Education: Goal: Emotional status will improve Outcome: Progressing   Problem: Education: Goal: Mental status will improve Outcome: Progressing   Problem: Education: Goal: Verbalization of understanding the information provided will improve Outcome: Progressing

## 2024-04-27 NOTE — Group Note (Signed)
 LCSW Group Therapy Note   Group Date: 04/27/2024 Start Time: 1300 End Time: 1400   Type of Therapy and Topic:  Group Therapy: Challenging Core Beliefs  Participation Level:  Did Not Attend  Description of Group:  Patients were educated about core beliefs and asked to identify one harmful core belief that they have. Patients were asked to explore from where those beliefs originate. Patients were asked to discuss how those beliefs make them feel and the resulting behaviors of those beliefs. They were then be asked if those beliefs are true and, if so, what evidence they have to support them. Lastly, group members were challenged to replace those negative core beliefs with helpful beliefs.   Therapeutic Goals:   1. Patient will identify harmful core beliefs and explore the origins of such beliefs. 2. Patient will identify feelings and behaviors that result from those core beliefs. 3. Patient will discuss whether such beliefs are true. 4.  Patient will replace harmful core beliefs with helpful ones.  Summary of Patient Progress:  Patient did not attend.   Therapeutic Modalities: Cognitive Behavioral Therapy; Solution-Focused Therapy   Belal Scallon M Carolyn Maniscalco, LCSWA 04/27/2024  2:27 PM

## 2024-04-27 NOTE — Progress Notes (Signed)
 [facility] MD Progress Note  04/27/2024 3:40 PM Kenneth Yang  MRN:  [id number]  Lewellyn Fultz is a 58 year old male with a pphx of cocaine use disorder, cocaine induced psychosis, and polysubstance abuse who presented from homeless shelter to ED for chest pain and endorsed SI. Patient UDS positive for cocaine during ED presentation. BAL 0.    Subjective:  Chart reviewed, case discussed in multidisciplinary meeting, patient seen during rounds.   Patient seen today for follow-up.  They indicate their goal is to get into long-term treatment that identify [facility] rescue mission.  Discussed plan to reach out to [facility] which they would need to attend prior to going to [facility] rescue mission.  He is irritable.  He denies ongoing withdrawal symptoms and has been refusing the Librium taper he is agreeable with continuing with symptom base scoring.  There is no tremor on exam.  He denies all symptoms of withdrawal on exam.  Blood pressures have been elevated however patient notes he has hypertension at baseline.  He denies SI, HI, AVH.  He reports stable mood appetite and sleep.  He did not require behavioral PRNs overnight.  He is minimally engaged in the progress interview for the most part giving 1-2 word answers.  He is not agreeable with starting psychotropic medications for depression or anxiety.  Past Psychiatric History: see h&P Family History: History reviewed. No pertinent family history. Social History:  Social History   Substance and Sexual Activity  Alcohol Use Yes   Alcohol/week: 114.0 standard drinks of alcohol   Types: 84 Cans of beer, 30 Shots of liquor per week   Comment: a pint of liquor and 12-24 pack of beer     Social History   Substance and Sexual Activity  Drug Use Yes   Types: Methamphetamines, Marijuana, Crack cocaine, Cocaine    Social History   Socioeconomic History   Marital status: Significant Other    Spouse name: Not on file   Number of children:  Not on file   Years of education: Not on file   Highest education level: Not on file  Occupational History   Not on file  Tobacco Use   Smoking status: Every Day    Types: E-cigarettes, Cigarettes   Smokeless tobacco: Never  Vaping Use   Vaping status: Former  Substance and Sexual Activity   Alcohol use: Yes    Alcohol/week: 114.0 standard drinks of alcohol    Types: 84 Cans of beer, 30 Shots of liquor per week    Comment: a pint of liquor and 12-24 pack of beer   Drug use: Yes    Types: Methamphetamines, Marijuana, Crack cocaine, Cocaine   Sexual activity: Yes    Partners: Female    Birth control/protection: Condom    Comment: Condoms sometimes  Other Topics Concern   Not on file  Social History Narrative   Not on file   Social Drivers of Health   Financial Resource Strain: Not on File (03/03/2023)   Received from [employer]    Financial Resource Strain: 0  Food Insecurity: Food Insecurity Present (04/25/2024)   Hunger Vital Sign    Worried About Running Out of Food in the Last Year: Sometimes true    Ran Out of Food in the Last Year: Sometimes true  Transportation Needs: Unmet Transportation Needs (04/25/2024)   PRAPARE - Administrator, Civil Service (Medical): Yes    Lack of Transportation (Non-Medical): Yes  Physical Activity: Not on File (03/03/2023)   Received from [facility]   Physical Activity    Physical Activity: 0  Stress: Not on File (03/03/2023)   Received from [facility]   Stress    Stress: 0  Social Connections: Not on File (03/03/2023)   Received from [facility]   Social Connections    Connectedness: 0   Past Medical History: History reviewed. No pertinent past medical history. History reviewed. No pertinent surgical history.  Current Medications: Current Facility-Administered Medications  Medication Dose Route Frequency Provider Last Rate Last Admin   acetaminophen  (TYLENOL ) tablet 650 mg  650 mg Oral Q6H PRN Montague,  Crystal J, NP       alum & mag hydroxide-simeth (MAALOX/MYLANTA) 200-200-20 MG/5ML suspension 30 mL  30 mL Oral Q4H PRN Montague, Crystal J, NP       alum & mag hydroxide-simeth (MAALOX/MYLANTA) 200-200-20 MG/5ML suspension 30 mL  30 mL Oral Q4H PRN Montague, Crystal J, NP       chlordiazePOXIDE (LIBRIUM) capsule 25 mg  25 mg Oral Q6H PRN May, Tanya, NP       haloperidol  (HALDOL ) tablet 5 mg  5 mg Oral TID PRN Montague, Crystal J, NP       And   diphenhydrAMINE  (BENADRYL ) capsule 50 mg  50 mg Oral TID PRN Montague, Crystal J, NP       haloperidol  lactate (HALDOL ) injection 5 mg  5 mg Intramuscular TID PRN Montague, Crystal J, NP       And   diphenhydrAMINE  (BENADRYL ) injection 50 mg  50 mg Intramuscular TID PRN Montague, Crystal J, NP       And   LORazepam  (ATIVAN ) injection 2 mg  2 mg Intramuscular TID PRN Montague, Crystal J, NP       haloperidol  lactate (HALDOL ) injection 10 mg  10 mg Intramuscular TID PRN Montague, Crystal J, NP       And   diphenhydrAMINE  (BENADRYL ) injection 50 mg  50 mg Intramuscular TID PRN Montague, Crystal J, NP       And   LORazepam  (ATIVAN ) injection 2 mg  2 mg Intramuscular TID PRN Montague, Crystal J, NP       hydrOXYzine  (ATARAX ) tablet 25 mg  25 mg Oral TID PRN Montague, Crystal J, NP       loperamide (IMODIUM) capsule 2-4 mg  2-4 mg Oral PRN May, Tanya, NP       magnesium  hydroxide (MILK OF MAGNESIA) suspension 30 mL  30 mL Oral Daily PRN Montague, Crystal J, NP       multivitamin with minerals tablet 1 tablet  1 tablet Oral Daily May, Tanya, NP   1 tablet at 04/27/24 1153   nicotine (NICODERM CQ - dosed in mg/24 hours) patch 21 mg  21 mg Transdermal Q0600 Montague, Crystal J, NP       ondansetron (ZOFRAN-ODT) disintegrating tablet 4 mg  4 mg Oral Q6H PRN May, Tanya, NP       thiamine (VITAMIN B1) tablet 100 mg  100 mg Oral Daily May, Tanya, NP   100 mg at 04/27/24 1154   traZODone  (DESYREL ) tablet 50 mg  50 mg Oral QHS PRN Sherril Camelia PARAS, NP         Lab Results:  Results for orders placed or performed during the hospital encounter of 04/25/24 (from the past 48 hours)  Ethanol     Status: None   Collection Time: 04/26/24  7:39 AM  Result Value Ref Range   Alcohol,  Ethyl (B) <15 <15 mg/dL    Comment: (NOTE) For medical purposes only. Performed at [facility], [address] [place], [place] [address]     Blood Alcohol level:  Lab Results  Component Value Date   [facility] <15 04/26/2024   ETH <10 06/17/2021    Metabolic Disorder Labs: Lab Results  Component Value Date   HGBA1C 6.3 (H) 02/04/2021   MPG 134.11 02/04/2021   No results found for: PROLACTIN Lab Results  Component Value Date   CHOL 145 02/04/2021   TRIG 98 02/04/2021   HDL 55 02/04/2021   CHOLHDL 2.6 02/04/2021   VLDL 20 02/04/2021   LDLCALC 70 02/04/2021    Physical Findings: AIMS:  , ,  ,  ,    CIWA:  CIWA-Ar Total: 1 COWS:      Psychiatric Specialty Exam:  Presentation  General Appearance:  Appropriate for Environment  Eye Contact: Fair  Speech: Clear and Coherent; Normal Rate  Speech Volume: Normal    Mood and Affect  Mood: Irritable  Affect: Congruent   Thought Process  Thought Processes: Coherent  Orientation:Full (Time, Place and Person)  Thought Content:WDL  Hallucinations:Hallucinations: None  Ideas of Reference:None  Suicidal Thoughts:Suicidal Thoughts: No  Homicidal Thoughts:Homicidal Thoughts: No   Sensorium  Memory: Immediate Fair; Recent Fair; Remote Fair  Judgment: Poor  Insight: Poor   Executive Functions  Concentration: Fair  Attention Span: Fair  Recall: [employer] of Knowledge: Fair  Language: Fair   Psychomotor Activity  Psychomotor Activity: Psychomotor Activity: Normal  Musculoskeletal: Strength & Muscle Tone: within normal limits Gait & Station: normal Assets  Assets: Communication Skills    Physical Exam: Physical Exam Vitals and nursing  note reviewed.  HENT:     Head: Atraumatic.  Eyes:     Extraocular Movements: Extraocular movements intact.  Pulmonary:     Effort: Pulmonary effort is normal.  Neurological:     Mental Status: He is alert and oriented to person, place, and time.    Review of Systems  Neurological:  Negative for tremors.  Psychiatric/Behavioral:  Positive for substance abuse. Negative for hallucinations and suicidal ideas. The patient does not have insomnia.    Blood pressure (!) 142/86, pulse 94, temperature 98.4 F (36.9 C), temperature source Oral, resp. rate 18, height 5' 8 (1.727 m), weight 87.1 kg, SpO2 99%. Body mass index is 29.19 kg/m.  Diagnosis: Principal Problem:   Substance induced mood disorder (HCC) Active Problems:   Suicidal ideations   PLAN: Safety and Monitoring:  -- Voluntary admission to inpatient psychiatric unit for safety, stabilization and treatment  -- Daily contact with patient to assess and evaluate symptoms and progress in treatment  -- Patient's case to be discussed in multi-disciplinary team meeting  -- Observation Level : q15 minute checks  -- Vital signs:  q12 hours  -- Precautions: suicide, elopement, and assault -- Encouraged patient to participate in unit milieu and in scheduled group therapies  2. Psychiatric Treatment:  Patient declining all medication at this time Librium withdrawal protocol Can consider Prozac  and Abilify  as this seemed effective during 2022 admission to [facility].      -- The risks/benefits/side-effects/alternatives to this medication were discussed in detail with the patient and time was given for questions. The patient consents to medication trial.                -- Metabolic profile and EKG monitoring obtained while on an atypical antipsychotic (BMI: Lipid Panel: HbgA1c: QTc:)              --  Encouraged patient to participate in unit milieu and in scheduled group therapies                3. Medical Issues Being Addressed:  No acute  concerns   4. Discharge Planning:   -- Social work and case management to assist with discharge planning and identification of hospital follow-up needs prior to discharge  -- Estimated LOS: 3-4 days  Donnice FORBES Right, PA-C 04/27/2024, 3:40 PM

## 2024-04-27 NOTE — [facility] Counselor (Signed)
 Patient expressed interest in [facility]. CSW touched base with program.   Program reported that they are a back to work program and do not offer detox or residential treatment. Program reported that they ask patients to attend [facility] or [facility] in [place], [place]. Program director reported that they work with both programs following a patient's detox to have them graduate to their program.   CSW went over this plan of action with the patient and provided him with needed numbers to contact and conduct assessments.   Patient expressed understanding.   CSW to continue to assess.   Chipper Koudelka, MSW, LCSWA 04/27/2024 10:47 AM

## 2024-04-27 NOTE — Group Note (Signed)
 Recreation Therapy Group Note   Group Topic:Other  Group Date: 04/27/2024 Start Time: 1500 End Time: 1545 Facilitators: Celestia Jeoffrey FORBES ARTICE, CTRS Location: Craft Room  Activity Description/Intervention: Therapeutic Drumming. Patients with peers and staff were given the opportunity to engage in a leader facilitated [facility] Group Empowerment Drumming Circle with staff from the [employer], in partnership with [employer]. Teaching laboratory technician and trained walt [place]ney, Norleen Mon leading with LRT observing and documenting intervention and pt response. This evidenced-based practice targets 7 areas of health and wellbeing in the human experience including: stress-reduction, exercise, self-expression, camaraderie/support, nurturing, spirituality, and music-making (leisure).    Goal Area(s) Addresses:  Patient will engage in pro-social way in music group.  Patient will follow directions of drum leader on the first prompt. Patient will demonstrate no behavioral issues during group.  Patient will identify if a reduction in stress level occurs as a result of participation in therapeutic drum circle.   Affect/Mood: N/A   Participation Level: Did not attend    Clinical Observations/Individualized Feedback: Patient did not attend group.   Plan: Continue to engage patient in RT group sessions 2-3x/week.   Jeoffrey FORBES Celestia, LRT, CTRS 04/27/2024 5:29 PM

## 2024-04-27 NOTE — Group Note (Signed)
 Date:  04/27/2024 Time:  9:00 PM  Group Topic/Focus:  Managing Feelings:   The focus of this group is to identify what feelings patients have difficulty handling and develop a plan to handle them in a healthier way upon discharge. Wrap-Up Group:   The focus of this group is to help patients review their daily goal of treatment and discuss progress on daily workbooks.    Participation Level:  Did Not Attend    Kenneth Yang Servant 04/27/2024, 9:00 PM

## 2024-04-28 MED ORDER — NICOTINE POLACRILEX 2 MG MT GUM
2.0000 mg | CHEWING_GUM | OROMUCOSAL | Status: DC | PRN
  Administered 2024-04-28 – 2024-05-01 (×4): 2 mg via ORAL
  Filled 2024-04-28 (×4): qty 1

## 2024-04-28 NOTE — Group Note (Signed)
 Date:  04/28/2024 Time:  10:25 AM  Group Topic/Focus:  Self Esteem Action Plan:   The focus of this group is to help patients create a plan to continue to build self-esteem after discharge.    Participation Level:  Did Not Attend   [name] 04/28/2024, 10:25 AM

## 2024-04-28 NOTE — Group Note (Signed)
 Recreation Therapy Group Note   Group Topic:General Recreation  Group Date: 04/28/2024 Start Time: 1510 End Time: 1600 Facilitators: Celestia Jeoffrey BRAVO, LRT, CTRS Location: Courtyard  Group Description: [employer]. LRT and patients played games of basketball, drew with chalk, and played corn hole while outside in the courtyard while getting fresh air and sunlight. Music was being played in the background. LRT and peers conversed about different games they have played before, what they do in their free time and anything else that is on their minds. LRT encouraged pts to drink water after being outside, sweating and getting their heart rate up.  Goal Area(s) Addressed: Patient will build on frustration tolerance skills. Patients will partake in a competitive play game with peers. Patients will gain knowledge of new leisure interest/hobby.     Affect/Mood: N/A   Participation Level: Did not attend    Clinical Observations/Individualized Feedback: Patient did not attend group.   Plan: Continue to engage patient in RT group sessions 2-3x/week.   Jeoffrey BRAVO Celestia, LRT, CTRS 04/28/2024 5:11 PM

## 2024-04-28 NOTE — Plan of Care (Signed)
  Problem: Education: Goal: Emotional status will improve Outcome: Not Met (add Reason) Goal: Mental status will improve Outcome: Progressing Goal: Verbalization of understanding the information provided will improve Outcome: Progressing   Problem: Activity: Goal: Interest or engagement in activities will improve Outcome: Not Met (add Reason)   Problem: Safety: Goal: Periods of time without injury will increase Outcome: Progressing

## 2024-04-28 NOTE — BHH Counselor (Addendum)
 Patient touched base with [facility] ([facility]) who reported that they have a single male bed available.   CSW touched base with the program on patient's behalf and spoke with admissions coordinator, Clara D. who confirmed. Requested documentation sent on patient's behalf.   Ms. Lindie confirmed receipt of documentation.   CSW to continue to assess.   Kenneth Yang, MSW, LCSWA 04/28/2024 9:39 AM

## 2024-04-28 NOTE — Progress Notes (Signed)
 [facility] MD Progress Note  04/28/2024 10:48 AM Alm Nadara Lesches  MRN:  [id number]  Kenneth Yang is a 58 year old male with a pphx of cocaine use disorder, cocaine induced psychosis, and polysubstance abuse who presented from homeless shelter to ED for chest pain and endorsed SI. Patient UDS positive for cocaine during ED presentation. BAL 0.    Subjective:  Chart reviewed, case discussed in multidisciplinary meeting, patient seen during rounds.   11/18: On interview today, patient is noted to be alert and oriented, irritable.  He is minimally engaged in interview.  He is working with social work team for placement at [facility].  He continues to be discharged focused and future oriented.  He denies SI/HI/plan and denies hallucinations.  He reports good sleep and appetite.  He denies current symptoms of depression or anxiety.  He continues to decline psychotropic medication at this time.  He denies withdrawal symptoms.  He did not require behavioral PRNs overnight.  11/17: Patient seen today for follow-up.  They indicate their goal is to get into long-term treatment that identify [facility] rescue mission.  Discussed plan to reach out to healing transitions which they would need to attend prior to going to [facility] rescue mission.  He is irritable.  He denies ongoing withdrawal symptoms and has been refusing the Librium taper he is agreeable with continuing with symptom base scoring.  There is no tremor on exam.  He denies all symptoms of withdrawal on exam.  Blood pressures have been elevated however patient notes he has hypertension at baseline.  He denies SI, HI, AVH.  He reports stable mood appetite and sleep.  He did not require behavioral PRNs overnight.  He is minimally engaged in the progress interview for the most part giving 1-2 word answers.  He is not agreeable with starting psychotropic medications for depression or anxiety.  Past Psychiatric History: see h&P Family History: History reviewed. No  pertinent family history. Social History:  Social History   Substance and Sexual Activity  Alcohol Use Yes   Alcohol/week: 114.0 standard drinks of alcohol   Types: 84 Cans of beer, 30 Shots of liquor per week   Comment: a pint of liquor and 12-24 pack of beer     Social History   Substance and Sexual Activity  Drug Use Yes   Types: Methamphetamines, Marijuana, Crack cocaine, Cocaine    Social History   Socioeconomic History   Marital status: Significant Other    Spouse name: Not on file   Number of children: Not on file   Years of education: Not on file   Highest education level: Not on file  Occupational History   Not on file  Tobacco Use   Smoking status: Every Day    Types: E-cigarettes, Cigarettes   Smokeless tobacco: Never  Vaping Use   Vaping status: Former  Substance and Sexual Activity   Alcohol use: Yes    Alcohol/week: 114.0 standard drinks of alcohol    Types: 84 Cans of beer, 30 Shots of liquor per week    Comment: a pint of liquor and 12-24 pack of beer   Drug use: Yes    Types: Methamphetamines, Marijuana, Crack cocaine, Cocaine   Sexual activity: Yes    Partners: Female    Birth control/protection: Condom    Comment: Condoms sometimes  Other Topics Concern   Not on file  Social History Narrative   Not on file   Social Drivers of Health   Financial Resource Strain:  Not on File (03/03/2023)   Received from [employer]    Financial Resource Strain: 0  Food Insecurity: Food Insecurity Present (04/25/2024)   Hunger Vital Sign    Worried About Running Out of Food in the Last Year: Sometimes true    Ran Out of Food in the Last Year: Sometimes true  Transportation Needs: Unmet Transportation Needs (04/25/2024)   PRAPARE - Administrator, Civil Service (Medical): Yes    Lack of Transportation (Non-Medical): Yes  Physical Activity: Not on File (03/03/2023)   Received from [facility]   Physical Activity    Physical  Activity: 0  Stress: Not on File (03/03/2023)   Received from [facility]   Stress    Stress: 0  Social Connections: Not on File (03/03/2023)   Received from [facility]   Social Connections    Connectedness: 0   Past Medical History: History reviewed. No pertinent past medical history. History reviewed. No pertinent surgical history.  Current Medications: Current Facility-Administered Medications  Medication Dose Route Frequency Provider Last Rate Last Admin   acetaminophen  (TYLENOL ) tablet 650 mg  650 mg Oral Q6H PRN Montague, Birch Farino J, NP       alum & mag hydroxide-simeth (MAALOX/MYLANTA) 200-200-20 MG/5ML suspension 30 mL  30 mL Oral Q4H PRN Montague, Alanys Godino J, NP       alum & mag hydroxide-simeth (MAALOX/MYLANTA) 200-200-20 MG/5ML suspension 30 mL  30 mL Oral Q4H PRN Montague, Ronon Ferger J, NP       chlordiazePOXIDE (LIBRIUM) capsule 25 mg  25 mg Oral Q6H PRN May, Tanya, NP       haloperidol  (HALDOL ) tablet 5 mg  5 mg Oral TID PRN Montague, Merranda Bolls J, NP       And   diphenhydrAMINE  (BENADRYL ) capsule 50 mg  50 mg Oral TID PRN Montague, Tej Murdaugh J, NP       haloperidol  lactate (HALDOL ) injection 5 mg  5 mg Intramuscular TID PRN Montague, Naiyah Klostermann J, NP       And   diphenhydrAMINE  (BENADRYL ) injection 50 mg  50 mg Intramuscular TID PRN Montague, Muna Demers J, NP       And   LORazepam  (ATIVAN ) injection 2 mg  2 mg Intramuscular TID PRN Montague, Axiel Fjeld J, NP       haloperidol  lactate (HALDOL ) injection 10 mg  10 mg Intramuscular TID PRN Montague, Radek Carnero J, NP       And   diphenhydrAMINE  (BENADRYL ) injection 50 mg  50 mg Intramuscular TID PRN Montague, Wynter Isaacs J, NP       And   LORazepam  (ATIVAN ) injection 2 mg  2 mg Intramuscular TID PRN Montague, Yahmir Sokolov J, NP       hydrOXYzine  (ATARAX ) tablet 25 mg  25 mg Oral TID PRN Montague, Ryla Cauthon J, NP       loperamide (IMODIUM) capsule 2-4 mg  2-4 mg Oral PRN May, Tanya, NP       magnesium  hydroxide (MILK OF MAGNESIA) suspension 30 mL  30 mL Oral Daily PRN  Montague, Katyana Trolinger J, NP       multivitamin with minerals tablet 1 tablet  1 tablet Oral Daily May, Tanya, NP   1 tablet at 04/28/24 0842   nicotine (NICODERM CQ - dosed in mg/24 hours) patch 21 mg  21 mg Transdermal Q0600 Montague, Trenna Kiely J, NP       ondansetron (ZOFRAN-ODT) disintegrating tablet 4 mg  4 mg Oral Q6H PRN May, Tanya, NP  thiamine (VITAMIN B1) tablet 100 mg  100 mg Oral Daily May, Tanya, NP   100 mg at 04/28/24 9157   traZODone  (DESYREL ) tablet 50 mg  50 mg Oral QHS PRN Sherril Camelia PARAS, NP        Lab Results:  No results found for this or any previous visit (from the past 48 hours).   Blood Alcohol level:  Lab Results  Component Value Date   [facility] <15 04/26/2024   ETH <10 06/17/2021    Metabolic Disorder Labs: Lab Results  Component Value Date   HGBA1C 6.3 (H) 02/04/2021   MPG 134.11 02/04/2021   No results found for: PROLACTIN Lab Results  Component Value Date   CHOL 145 02/04/2021   TRIG 98 02/04/2021   HDL 55 02/04/2021   CHOLHDL 2.6 02/04/2021   VLDL 20 02/04/2021   LDLCALC 70 02/04/2021    Physical Findings: AIMS:  , ,  ,  ,    CIWA:  CIWA-Ar Total: 1 COWS:      Psychiatric Specialty Exam:  Presentation  General Appearance:  Appropriate for Environment  Eye Contact: Fair  Speech: Clear and Coherent; Normal Rate  Speech Volume: Normal    Mood and Affect  Mood: Irritable  Affect: Congruent   Thought Process  Thought Processes: Coherent  Orientation:Full (Time, Place and Person)  Thought Content:WDL  Hallucinations: No  Ideas of Reference:None  Suicidal Thoughts: Denies Homicidal Thoughts: Denies   Sensorium  Memory: Immediate Fair; Recent Fair; Remote Fair  Judgment: Poor  Insight: Poor   Executive Functions  Concentration: Fair  Attention Span: Fair  Recall: [employer] of Knowledge: Fair  Language: Fair   Psychomotor Activity  Psychomotor Activity: No data  recorded  Musculoskeletal: Strength & Muscle Tone: within normal limits Gait & Station: normal Assets  Assets: Communication Skills    Physical Exam: Physical Exam Vitals and nursing note reviewed.  HENT:     Head: Atraumatic.  Eyes:     Extraocular Movements: Extraocular movements intact.  Pulmonary:     Effort: Pulmonary effort is normal.  Neurological:     Mental Status: He is alert and oriented to person, place, and time.    Review of Systems  Neurological:  Negative for tremors.  Psychiatric/Behavioral:  Positive for substance abuse. Negative for hallucinations and suicidal ideas. The patient does not have insomnia.    Blood pressure 127/85, pulse 82, temperature 98.3 F (36.8 C), temperature source Oral, resp. rate 16, height 5' 8 (1.727 m), weight 87.1 kg, SpO2 98%. Body mass index is 29.19 kg/m.  Diagnosis: Principal Problem:   Substance induced mood disorder (HCC) Active Problems:   Suicidal ideations   PLAN: Safety and Monitoring:  -- Voluntary admission to inpatient psychiatric unit for safety, stabilization and treatment  -- Daily contact with patient to assess and evaluate symptoms and progress in treatment  -- Patient's case to be discussed in multi-disciplinary team meeting  -- Observation Level : q15 minute checks  -- Vital signs:  q12 hours  -- Precautions: suicide, elopement, and assault -- Encouraged patient to participate in unit milieu and in scheduled group therapies  2. Psychiatric Treatment:  Patient declining all medication at this time - patient does not meet criteria for nonemergent enforced medication at this time.  Librium withdrawal protocol Can consider Prozac  and Abilify  as this seemed effective during 2022 admission to [facility].      -- The risks/benefits/side-effects/alternatives to this medication were discussed in detail with the patient and  time was given for questions. The patient consents to medication trial.                --  Metabolic profile and EKG monitoring obtained while on an atypical antipsychotic (BMI: Lipid Panel: HbgA1c: QTc:)              -- Encouraged patient to participate in unit milieu and in scheduled group therapies                3. Medical Issues Being Addressed:  No acute concerns   4. Discharge Planning:   -- Social work and case management to assist with discharge planning and identification of hospital follow-up needs prior to discharge  -- Estimated LOS: 3-4 days  Camelia LITTIE Lukes, PA-C 04/28/2024, 10:48 AM

## 2024-04-28 NOTE — Group Note (Signed)
 Recreation Therapy Group Note   Group Topic:Self-Esteem  Group Date: 04/28/2024 Start Time: 1000 End Time: 1050 Facilitators: Celestia Jeoffrey BRAVO, LRT, CTRS Location: Craft Room  Group Description: Positive Affirmation Worksheet. Patients and LRT discussed the importance of self-love/self-esteem and things that cause it to fluctuate, including our mental health. Patients completed a worksheet that helps them identify 24 different strengths and qualities about themselves. Pt encouraged to read aloud at least 3 off their sheet to the group. LRT and pts discussed how this can be applied to daily life post-discharge. After completing worksheet, patients played Positive Affirmation Bingo and won candy, approved by [employer], as prizes.   Goal Area(s) Addressed: Patient will identify positive qualities about themselves. Patient will learn new positive affirmations.  Patient will recite positive qualities and affirmations aloud to the group.  Patient will practice positive self-talk.  Patient will increase communication.    Affect/Mood: N/A   Participation Level: Did not attend    Clinical Observations/Individualized Feedback: Patient did not attend group.   Plan: Continue to engage patient in RT group sessions 2-3x/week.   Jeoffrey BRAVO Celestia, LRT, CTRS 04/28/2024 11:24 AM

## 2024-04-28 NOTE — Plan of Care (Signed)
   Problem: Education: Goal: Emotional status will improve Outcome: Progressing

## 2024-04-28 NOTE — Group Note (Signed)
 LCSW Group Therapy Note   Group Date: 04/28/2024 Start Time: 1310 End Time: 1400   Type of Therapy and Topic:  Group Therapy: Boundaries  Participation Level:  Did Not Attend  Description of Group: This group will address the use of boundaries in their personal lives. Patients will explore why boundaries are important, the difference between healthy and unhealthy boundaries, and negative and postive outcomes of different boundaries and will look at how boundaries can be crossed.  Patients will be encouraged to identify current boundaries in their own lives and identify what kind of boundary is being set. Facilitators will guide patients in utilizing problem-solving interventions to address and correct types boundaries being used and to address when no boundary is being used. Understanding and applying boundaries will be explored and addressed for obtaining and maintaining a balanced life. Patients will be encouraged to explore ways to assertively make their boundaries and needs known to significant others in their lives, using other group members and facilitator for role play, support, and feedback.  Therapeutic Goals:  1.  Patient will identify areas in their life where setting clear boundaries could be  used to improve their life.  2.  Patient will identify signs/triggers that a boundary is not being respected. 3.  Patient will identify two ways to set boundaries in order to achieve balance in  their lives: 4.  Patient will demonstrate ability to communicate their needs and set boundaries  through discussion and/or role plays  Summary of Patient Progress:   X  Therapeutic Modalities:   Cognitive Behavioral Therapy Solution-Focused Therapy  Sherryle JINNY Margo, LCSW 04/28/2024  1:52 PM

## 2024-04-28 NOTE — Progress Notes (Addendum)
 Pt isolative to room this evening, he is medication compliant, affect flat, mood depresses. Pt slept the evening in his room, denied any issues today.      04/28/24 2200  Psych Admission Type (Psych Patients Only)  Admission Status Voluntary  Psychosocial Assessment  Patient Complaints None  Eye Contact Brief  Facial Expression Anxious  Affect Appropriate to circumstance  Speech Logical/coherent  Interaction Seclusive  Motor Activity Slow  Appearance/Hygiene Unremarkable  Behavior Characteristics Cooperative  Mood Irritable  Thought Process  Coherency WDL  Content WDL  Delusions None reported or observed  Perception WDL  Hallucination None reported or observed  Judgment Impaired  Confusion None  Danger to Self  Current suicidal ideation? Denies  Agreement Not to Harm Self Yes  Description of Agreement verbal

## 2024-04-28 NOTE — Group Note (Signed)
 Date:  04/28/2024 Time:  8:38 PM  Group Topic/Focus:  Wrap-Up Group:   The focus of this group is to help patients review their daily goal of treatment and discuss progress on daily workbooks.    Participation Level:  Did Not Attend  Participation Quality:  none  Affect:  none  Cognitive:  none  Insight: None  Engagement in Group:  none  Modes of Intervention:  none  Additional Comments:  none   Kerri Katz 04/28/2024, 8:38 PM

## 2024-04-28 NOTE — Plan of Care (Signed)
   Problem: Education: Goal: Knowledge of Kenneth Yang General Education information/materials will improve Outcome: Progressing Goal: Emotional status will improve Outcome: Progressing Goal: Mental status will improve Outcome: Progressing Goal: Verbalization of understanding the information provided will improve Outcome: Progressing   Problem: Activity: Goal: Interest or engagement in activities will improve Outcome: Progressing Goal: Sleeping patterns will improve Outcome: Progressing   Problem: Coping: Goal: Ability to verbalize frustrations and anger appropriately will improve Outcome: Progressing Goal: Ability to demonstrate self-control will improve Outcome: Progressing

## 2024-04-28 NOTE — Progress Notes (Signed)
   04/28/24 0900  Psych Admission Type (Psych Patients Only)  Admission Status Voluntary  Psychosocial Assessment  Patient Complaints Other (Comment) (requesting to speak to soical worker states I have found a bed at [facility] but there is only one, I am ready to leave here RN notified his social worker @ [phone])  Eye Contact Avoids  Facial Expression Anxious  Affect Appropriate to circumstance  Speech Logical/coherent  Interaction Superficial  Motor Activity Other (Comment) (appropriate)  Appearance/Hygiene Unremarkable  Behavior Characteristics Cooperative  Mood Labile  Thought Process  Coherency WDL  Content WDL  Delusions None reported or observed  Perception WDL  Hallucination None reported or observed  Judgment Impaired  Confusion None  Danger to Self  Current suicidal ideation? Denies

## 2024-04-29 NOTE — Group Note (Signed)
 Date:  04/29/2024 Time:  8:50 PM  Group Topic/Focus:  Wrap-Up Group:   The focus of this group is to help patients review their daily goal of treatment and discuss progress on daily workbooks.    Participation Level:  Did Not Attend    Arlester CHRISTELLA Servant 04/29/2024, 8:50 PM

## 2024-04-29 NOTE — Group Note (Signed)
 Date:  04/29/2024 Time:  11:12 AM  Group Topic/Focus:  Pet Therapy: Rollo the Dog has made an appearance on the unit to help contribute to the patients' mental health!    Participation Level:  Did Not Attend   [name] 04/29/2024, 11:12 AM

## 2024-04-29 NOTE — Progress Notes (Signed)
 [facility] MD Progress Note  04/29/2024 12:39 PM Kenneth Yang  MRN:  [id number]  Kenneth Yang is a 58 year old male with a pphx of cocaine use disorder, cocaine induced psychosis, and polysubstance abuse who presented from homeless shelter to ED for chest pain and endorsed SI. Patient UDS positive for cocaine during ED presentation. BAL 0.    Subjective:  Chart reviewed, case discussed in multidisciplinary meeting, patient seen during rounds.  11/19: On interview today, patient is noted to be alert and oriented, calm and cooperative.  He is more engaged in interview with provider today.  Patient states he has been reflecting on past choices and future goals.  He states he is motivated to remain sober.  He has been accepted at [facility].  He remains discharged focused and future oriented.  He continues to decline psychotropic medication at this time.  He denies SI/HI/plan and denies hallucinations.  He denies current symptoms of depression or anxiety.  He has maintained safe behaviors on the unit.  He is able to discuss support system, coping skills, and crisis resources.  He continues to deny withdrawal symptoms.  He did not require any behavioral PRNs overnight.  He does not voice any concerns or complaints at this time.  11/18: On interview today, patient is noted to be alert and oriented, irritable.  He is minimally engaged in interview.  He is working with social work team for placement at [facility].  He continues to be discharged focused and future oriented.  He denies SI/HI/plan and denies hallucinations.  He reports good sleep and appetite.  He denies current symptoms of depression or anxiety.  He continues to decline psychotropic medication at this time.  He denies withdrawal symptoms.  He did not require behavioral PRNs overnight.  11/17: Patient seen today for follow-up.  They indicate their goal is to get into long-term treatment that identify [facility] rescue mission.  Discussed  plan to reach out to healing transitions which they would need to attend prior to going to [facility] rescue [facility].  He is irritable.  He denies ongoing withdrawal symptoms and has been refusing the Librium taper he is agreeable with continuing with symptom base scoring.  There is no tremor on exam.  He denies all symptoms of withdrawal on exam.  Blood pressures have been elevated however patient notes he has hypertension at baseline.  He denies SI, HI, AVH.  He reports stable mood appetite and sleep.  He did not require behavioral PRNs overnight.  He is minimally engaged in the progress interview for the most part giving 1-2 word answers.  He is not agreeable with starting psychotropic medications for depression or anxiety.  Past Psychiatric History: see h&P Family History: History reviewed. No pertinent family history. Social History:  Social History   Substance and Sexual Activity  Alcohol Use Yes   Alcohol/week: 114.0 standard drinks of alcohol   Types: 84 Cans of beer, 30 Shots of liquor per week   Comment: a pint of liquor and 12-24 pack of beer     Social History   Substance and Sexual Activity  Drug Use Yes   Types: Methamphetamines, Marijuana, Crack cocaine, Cocaine    Social History   Socioeconomic History   Marital status: Significant Other    Spouse name: Not on file   Number of children: Not on file   Years of education: Not on file   Highest education level: Not on file  Occupational History   Not  on file  Tobacco Use   Smoking status: Every Day    Types: E-cigarettes, Cigarettes   Smokeless tobacco: Never  Vaping Use   Vaping status: Former  Substance and Sexual Activity   Alcohol use: Yes    Alcohol/week: 114.0 standard drinks of alcohol    Types: 84 Cans of beer, 30 Shots of liquor per week    Comment: a pint of liquor and 12-24 pack of beer   Drug use: Yes    Types: Methamphetamines, Marijuana, Crack cocaine, Cocaine   Sexual activity: Yes    Partners:  Female    Birth control/protection: Condom    Comment: Condoms sometimes  Other Topics Concern   Not on file  Social History Narrative   Not on file   Social Drivers of Health   Financial Resource Strain: Not on File (03/03/2023)   Received from [employer]    Financial Resource Strain: 0  Food Insecurity: Food Insecurity Present (04/25/2024)   Hunger Vital Sign    Worried About Running Out of Food in the Last Year: Sometimes true    Ran Out of Food in the Last Year: Sometimes true  Transportation Needs: Unmet Transportation Needs (04/25/2024)   PRAPARE - Administrator, Civil Service (Medical): Yes    Lack of Transportation (Non-Medical): Yes  Physical Activity: Not on File (03/03/2023)   Received from [facility]   Physical Activity    Physical Activity: 0  Stress: Not on File (03/03/2023)   Received from [facility]   Stress    Stress: 0  Social Connections: Not on File (03/03/2023)   Received from [facility]   Social Connections    Connectedness: 0   Past Medical History: History reviewed. No pertinent past medical history. History reviewed. No pertinent surgical history.  Current Medications: Current Facility-Administered Medications  Medication Dose Route Frequency Provider Last Rate Last Admin   acetaminophen  (TYLENOL ) tablet 650 mg  650 mg Oral Q6H PRN Montague, Sharbel Sahagun J, NP       alum & mag hydroxide-simeth (MAALOX/MYLANTA) 200-200-20 MG/5ML suspension 30 mL  30 mL Oral Q4H PRN Montague, Eliya Geiman J, NP       alum & mag hydroxide-simeth (MAALOX/MYLANTA) 200-200-20 MG/5ML suspension 30 mL  30 mL Oral Q4H PRN Montague, Aarna Mihalko J, NP       haloperidol  (HALDOL ) tablet 5 mg  5 mg Oral TID PRN Montague, Kelvin Burpee J, NP       And   diphenhydrAMINE  (BENADRYL ) capsule 50 mg  50 mg Oral TID PRN Montague, Sandy Haye J, NP       haloperidol  lactate (HALDOL ) injection 5 mg  5 mg Intramuscular TID PRN Montague, Salli Bodin J, NP       And   diphenhydrAMINE  (BENADRYL )  injection 50 mg  50 mg Intramuscular TID PRN Montague, Hymie Gorr J, NP       And   LORazepam  (ATIVAN ) injection 2 mg  2 mg Intramuscular TID PRN Montague, Suzzanne Brunkhorst J, NP       haloperidol  lactate (HALDOL ) injection 10 mg  10 mg Intramuscular TID PRN Montague, Kamron Vanwyhe J, NP       And   diphenhydrAMINE  (BENADRYL ) injection 50 mg  50 mg Intramuscular TID PRN Montague, Anadalay Macdonell J, NP       And   LORazepam  (ATIVAN ) injection 2 mg  2 mg Intramuscular TID PRN Montague, Teah Votaw J, NP       hydrOXYzine  (ATARAX ) tablet 25 mg  25 mg Oral TID PRN Sherril,  Yetunde Leis J, NP       magnesium  hydroxide (MILK OF MAGNESIA) suspension 30 mL  30 mL Oral Daily PRN Montague, Ryiah Bellissimo J, NP       multivitamin with minerals tablet 1 tablet  1 tablet Oral Daily May, Tanya, NP   1 tablet at 04/29/24 9196   nicotine (NICODERM CQ - dosed in mg/24 hours) patch 21 mg  21 mg Transdermal Q0600 Montague, Jaquese Irving J, NP       nicotine polacrilex (NICORETTE) gum 2 mg  2 mg Oral PRN Jadapalle, Sree, MD   2 mg at 04/28/24 1706   thiamine (VITAMIN B1) tablet 100 mg  100 mg Oral Daily May, Tanya, NP   100 mg at 04/29/24 0803   traZODone  (DESYREL ) tablet 50 mg  50 mg Oral QHS PRN Sherril Camelia PARAS, NP        Lab Results:  No results found for this or any previous visit (from the past 48 hours).   Blood Alcohol level:  Lab Results  Component Value Date   [facility] <15 04/26/2024   ETH <10 06/17/2021    Metabolic Disorder Labs: Lab Results  Component Value Date   HGBA1C 6.3 (H) 02/04/2021   MPG 134.11 02/04/2021   No results found for: PROLACTIN Lab Results  Component Value Date   CHOL 145 02/04/2021   TRIG 98 02/04/2021   HDL 55 02/04/2021   CHOLHDL 2.6 02/04/2021   VLDL 20 02/04/2021   LDLCALC 70 02/04/2021    Physical Findings: AIMS:  , ,  ,  ,    CIWA:  CIWA-Ar Total: 0 COWS:      Psychiatric Specialty Exam:  Presentation  General Appearance:  Appropriate for Environment  Eye Contact: Fair  Speech: Clear  and Coherent; Normal Rate  Speech Volume: Normal    Mood and Affect  Mood: Euthymic  Affect: Appropriate   Thought Process  Thought Processes: Coherent  Orientation:Full (Time, Place and Person)  Thought Content:WDL  Hallucinations: None  Ideas of Reference:None  Suicidal Thoughts: Denies  Homicidal Thoughts: Denies   Sensorium  Memory: Immediate Fair; Recent Fair; Remote Fair  Judgment: Fair  Insight: Fair  Art Therapist  Concentration: Fair  Attention Span: Fair  Recall: [employer] of Knowledge: Fair  Language: Fair   Psychomotor Activity  Psychomotor Activity: Normal  Musculoskeletal: Strength & Muscle Tone: within normal limits Gait & Station: normal Assets  Assets: Communication Skills    Physical Exam: Physical Exam Vitals and nursing note reviewed.  HENT:     Head: Atraumatic.  Eyes:     Extraocular Movements: Extraocular movements intact.  Pulmonary:     Effort: Pulmonary effort is normal.  Neurological:     Mental Status: He is alert and oriented to person, place, and time.  Psychiatric:        Attention and Perception: Attention and perception normal. He does not perceive auditory or visual hallucinations.        Mood and Affect: Mood normal.        Speech: Speech normal.        Behavior: Behavior is cooperative.        Thought Content: Thought content does not include homicidal or suicidal ideation. Thought content does not include homicidal or suicidal plan.        Cognition and Memory: Cognition normal.    Review of Systems  Neurological:  Negative for tremors.  Psychiatric/Behavioral:  Positive for substance abuse. Negative for depression, hallucinations and suicidal ideas.  The patient is not nervous/anxious and does not have insomnia.    Blood pressure 125/84, pulse 68, temperature 98 F (36.7 C), temperature source Oral, resp. rate 19, height 5' 8 (1.727 m), weight 87.1 kg, SpO2 98%. Body mass  index is 29.19 kg/m.  Diagnosis: Principal Problem:   Substance induced mood disorder (HCC) Active Problems:   Suicidal ideations   PLAN: Safety and Monitoring:  -- Voluntary admission to inpatient psychiatric unit for safety, stabilization and treatment  -- Daily contact with patient to assess and evaluate symptoms and progress in treatment  -- Patient's case to be discussed in multi-disciplinary team meeting  -- Observation Level : q15 minute checks  -- Vital signs:  q12 hours  -- Precautions: suicide, elopement, and assault -- Encouraged patient to participate in unit milieu and in scheduled group therapies  2. Psychiatric Treatment:  Patient declining all medication at this time - patient does not meet criteria for nonemergent enforced medication at this time.  Librium withdrawal protocol Can consider Prozac  and Abilify  as this seemed effective during 2022 admission to [facility].      -- The risks/benefits/side-effects/alternatives to this medication were discussed in detail with the patient and time was given for questions. The patient consents to medication trial.                -- Metabolic profile and EKG monitoring obtained while on an atypical antipsychotic (BMI: Lipid Panel: HbgA1c: QTc:)              -- Encouraged patient to participate in unit milieu and in scheduled group therapies                3. Medical Issues Being Addressed:  No acute concerns   4. Discharge Planning:             -- Friday  -- Social work and case management to assist with discharge planning and identification of hospital follow-up needs prior to discharge  -- Estimated LOS: 5-7 days  Camelia LITTIE Lukes, PA-C 04/29/2024, 12:39 PM

## 2024-04-29 NOTE — Group Note (Signed)
 Date:  04/29/2024 Time:  10:55 AM  Group Topic/Focus:  Early Warning Signs:   The focus of this group is to help patients identify signs or symptoms they exhibit before slipping into an unhealthy state or crisis.    Participation Level:  Active  Participation Quality:  Appropriate  Affect:  Appropriate  Cognitive:  Appropriate  Insight: Appropriate  Engagement in Group:  Engaged  Modes of Intervention:  Activity  Additional Comments:    Camellia HERO Kenslei Hearty 04/29/2024, 10:55 AM

## 2024-04-29 NOTE — Progress Notes (Signed)
   04/29/24 2300  Psych Admission Type (Psych Patients Only)  Admission Status Voluntary  Psychosocial Assessment  Patient Complaints None  Eye Contact Brief  Facial Expression Anxious  Affect Appropriate to circumstance  Speech Logical/coherent  Interaction Seclusive  Motor Activity Slow  Appearance/Hygiene Unremarkable  Behavior Characteristics Cooperative;Appropriate to situation  Mood Anxious;Depressed  Thought Process  Coherency WDL  Content WDL  Delusions None reported or observed  Perception WDL  Hallucination None reported or observed  Judgment Limited  Confusion None  Danger to Self  Current suicidal ideation? Denies  Agreement Not to Harm Self Yes  Description of Agreement Verbal  Danger to Others  Danger to Others None reported or observed

## 2024-04-29 NOTE — BHH Counselor (Signed)
 CSW touched base with [facility] and spoke with admissions coordinator, Valentin Bjork who reported that the patient has been ACCEPTED to their program.   Ms. Valentin reported that they conduct intakes Tuesdays-Thursdays and patient must arrive before 11 AM.   Ms. Valentin reported that the program has two in house psychiatrist and patient will receive therapy through the program. Ms. Valentin reported that patient's are usually seen by a psychiatrist within two weeks of admission. Program requests that patient is sent with at least 2 week supply of medication.   This has been communicated to team and patient.   CSW to continue to assess.   Bransyn Adami, MSW, LCSWA 04/29/2024 9:32 AM

## 2024-04-29 NOTE — Plan of Care (Signed)
  Problem: Activity: Goal: Sleeping patterns will improve Outcome: Progressing   

## 2024-04-29 NOTE — Group Note (Signed)
 Date:  04/29/2024 Time:  5:07 PM  Group Topic/Focus:  Wellness Toolbox:   The focus of this group is to discuss various aspects of wellness, balancing those aspects and exploring ways to increase the ability to experience wellness.  Patients will create a wellness toolbox for use upon discharge.    Participation Level:  Did Not Attend   Deitra Clap [facility] 04/29/2024, 5:07 PM

## 2024-04-29 NOTE — Group Note (Signed)
 [facility] LCSW Group Therapy Note   Group Date: 04/29/2024 Start Time: 1300 End Time: 1400   Type of Therapy/Topic:  Group Therapy:  Emotion Regulation  Participation Level:  Active   Description of Group:    The purpose of this group is to assist patients in learning to regulate negative emotions and experience positive emotions. Patients will be guided to discuss ways in which they have been vulnerable to their negative emotions. These vulnerabilities will be juxtaposed with experiences of positive emotions or situations, and patients challenged to use positive emotions to combat negative ones. Special emphasis will be placed on coping with negative emotions in conflict situations, and patients will process healthy conflict resolution skills.  Therapeutic Goals: Patient will identify two positive emotions or experiences to reflect on in order to balance out negative emotions:  Patient will label two or more emotions that they find the most difficult to experience:  Patient will be able to demonstrate positive conflict resolution skills through discussion or role plays:   Summary of Patient Progress: Patient came in during the last few minutes of group. During time in the room he shared a bunch of his emotions and situations that he has going on in his life. Pt was actively engaged in the discussion. He appeared open and receptive to feedback/comments from both his peers and the facilitator.   Therapeutic Modalities:   Cognitive Behavioral Therapy Feelings Identification Dialectical Behavioral Therapy   Nadara JONELLE Fam, LCSW

## 2024-04-30 MED ORDER — ADULT MULTIVITAMIN W/MINERALS CH: 1.0000 | ORAL_TABLET | Freq: Every day | ORAL | Status: AC

## 2024-04-30 NOTE — Group Note (Signed)
 LCSW Group Therapy Note  Group Date: 04/30/2024 Start Time: 1300 End Time: 1400   Type of Therapy and Topic:  Group Therapy: Using I Statements  Participation Level:  Did Not Attend  Description of Group:  Patients were asked to provide details of some interpersonal conflicts they have experienced. Patients were then educated about "I" statements, communication which focuses on feelings or views of the speaker rather than what the other person is doing. T group members were asked to reflect on past conflicts and to provide specific examples for utilizing "I" statements.  Therapeutic Goals:  Patients will verbalize understanding of ineffective communication and effective communication. Patients will be able to empathize with whom they are having conflict. Patients will practice effective communication in the form of "I" statements.    Summary of Patient Progress:  Patient did not attend.   Therapeutic Modalities:   Cognitive Behavioral Therapy Solution-Focused Therapy    Alveta CHRISTELLA Kerns, LCSW 04/30/2024  2:05 PM

## 2024-04-30 NOTE — Plan of Care (Signed)
   Problem: Education: Goal: Emotional status will improve Outcome: Progressing Goal: Mental status will improve Outcome: Progressing

## 2024-04-30 NOTE — Plan of Care (Signed)
   Problem: Education: Goal: Knowledge of Kenneth Yang General Education information/materials will improve Outcome: Progressing Goal: Emotional status will improve Outcome: Progressing Goal: Mental status will improve Outcome: Progressing Goal: Verbalization of understanding the information provided will improve Outcome: Progressing   Problem: Activity: Goal: Interest or engagement in activities will improve Outcome: Progressing Goal: Sleeping patterns will improve Outcome: Progressing   Problem: Coping: Goal: Ability to verbalize frustrations and anger appropriately will improve Outcome: Progressing Goal: Ability to demonstrate self-control will improve Outcome: Progressing

## 2024-04-30 NOTE — Progress Notes (Signed)
   04/30/24 1940  Psychosocial Assessment  Patient Complaints None  Eye Contact Brief  Facial Expression Anxious  Affect Appropriate to circumstance  Speech Logical/coherent  Interaction Isolative  Motor Activity Slow  Appearance/Hygiene Unremarkable  Behavior Characteristics Cooperative;Appropriate to situation  Mood Anxious  Thought Process  Coherency WDL  Content WDL  Delusions None reported or observed  Perception WDL  Hallucination None reported or observed  Judgment Limited  Confusion None  Danger to Self  Current suicidal ideation? Denies  Agreement Not to Harm Self Yes  Description of Agreement verbal  Danger to Others  Danger to Others None reported or observed

## 2024-04-30 NOTE — Group Note (Signed)
 Recreation Therapy Group Note   Group Topic:General Recreation  Group Date: 04/30/2024 Start Time: 1500 End Time: 1555 Facilitators: Celestia Jeoffrey BRAVO, LRT, CTRS Location: Courtyard  Group Description: [employer]. LRT and patients played games of basketball, drew with chalk, and played corn hole while outside in the courtyard while getting fresh air and sunlight. Music was being played in the background. LRT and peers conversed about different games they have played before, what they do in their free time and anything else that is on their minds. LRT encouraged pts to drink water  after being outside, sweating and getting their heart rate up.  Goal Area(s) Addressed: Patient will build on frustration tolerance skills. Patients will partake in a competitive play game with peers. Patients will gain knowledge of new leisure interest/hobby.    Affect/Mood: N/A   Participation Level: Did not attend    Clinical Observations/Individualized Feedback: Patient did not attend group.   Plan: Continue to engage patient in RT group sessions 2-3x/week.   Jeoffrey BRAVO Celestia, LRT, CTRS 04/30/2024 4:59 PM

## 2024-04-30 NOTE — Progress Notes (Signed)
 Per housekeeping staff, patient is refusing to let them enter and clear his room. Multiple attempts were made and patient continues to refuse. Per housekeeping staff, today is the third day that they have attempted to clean his room, and today is the third day that he has refused.

## 2024-04-30 NOTE — Group Note (Signed)
 Date:  04/30/2024 Time:  8:45 PM  Group Topic/Focus:  Orientation:   The focus of this group is to educate the patient on the purpose and policies of crisis stabilization and provide a format to answer questions about their admission.  The group details unit policies and expectations of patients while admitted. Self Care:   The focus of this group is to help patients understand the importance of self-care in order to improve or restore emotional, physical, spiritual, interpersonal, and financial health.    Participation Level:  Did Not Attend  Participation Quality:  NONE  Affect:  NONE  Cognitive:  NONE  Insight: None  Engagement in Group:  NONE  Modes of Intervention:  NONE  Additional Comments:  NONE   Luella Gardenhire 04/30/2024, 8:45 PM

## 2024-04-30 NOTE — BHH Suicide Risk Assessment (Signed)
 [facility] Discharge Suicide Risk Assessment   Principal Problem: Substance induced mood disorder (HCC) Discharge Diagnoses: Principal Problem:   Substance induced mood disorder (HCC) Active Problems:   Suicidal ideations   Total Time spent with patient: 30 minutes  Musculoskeletal: Strength & Muscle Tone: within normal limits Gait & Station: normal Patient leans: N/A  Psychiatric Specialty Exam  Presentation  General Appearance:  Appropriate for Environment  Eye Contact: Fair  Speech: Clear and Coherent  Speech Volume: Normal  Handedness:No data recorded  Mood and Affect  Mood: Depressed  Duration of Depression Symptoms: No data recorded Affect: Appropriate   Thought Process  Thought Processes: Coherent; Linear  Descriptions of Associations:Intact  Orientation:Full (Time, Place and Person)  Thought Content:Logical  History of Schizophrenia/Schizoaffective disorder:No data recorded Duration of Psychotic Symptoms:No data recorded Hallucinations:Hallucinations: None  Ideas of Reference:None  Suicidal Thoughts:Suicidal Thoughts: No  Homicidal Thoughts:Homicidal Thoughts: No   Sensorium  Memory: Immediate Good; Recent Good  Judgment: Fair  Insight: Fair   Art Therapist  Concentration: Fair  Attention Span: Fair  Recall: Good  Fund of Knowledge: Fair  Language: Fair   Psychomotor Activity  Psychomotor Activity: Psychomotor Activity: Normal   Assets  Assets: Communication Skills; Desire for Improvement; Social Support   Sleep  Sleep: Sleep: Good  Estimated Sleeping Duration (Last 24 Hours): 12.00-16.00 hours (Due to Daylight Saving Time, the durations displayed may not accurately represent documentation during the time change interval)  Physical Exam: Physical Exam ROS Blood pressure 134/80, pulse 91, temperature 97.8 F (36.6 C), temperature source Oral, resp. rate 20, height 5' 8 (1.727 m), weight 87.1 kg,  SpO2 98%. Body mass index is 29.19 kg/m.  Mental Status Per Nursing Assessment::   On Admission:  Suicidal ideation indicated by patient  Demographic Factors:  Male, Low socioeconomic status, and Unemployed  Loss Factors: Decrease in vocational status and Financial problems/change in socioeconomic status  Historical Factors: NA  Risk Reduction Factors:   Positive social support and Positive coping skills or problem solving skills  Continued Clinical Symptoms:  Alcohol/Substance Abuse/Dependencies  Cognitive Features That Contribute To Risk:  None    Suicide Risk:  Minimal: No identifiable suicidal ideation.  Patients presenting with no risk factors but with morbid ruminations; may be classified as minimal risk based on the severity of the depressive symptoms   Follow-up Information     [facility] Follow up.   Why: Therapy and psychiatric care will be arranged for you within two weeks of your admission. Contact information: [address] [place], [place] [address]  Phone: [phone] Fax: [phone]                Plan Of Care/Follow-up recommendations:  Activity:  as tolerated  Camelia LITTIE Lukes, PA-C 04/30/2024, 4:08 PM

## 2024-04-30 NOTE — Progress Notes (Addendum)
 [facility] MD Progress Note  04/30/2024 4:11 PM Kenneth Yang  MRN:  [id number]  Kenneth Yang is a 58 year old male with a pphx of cocaine use disorder, cocaine induced psychosis, and polysubstance abuse who presented from homeless shelter to ED for chest pain and endorsed SI. Patient UDS positive for cocaine during ED presentation. BAL 0.    Subjective:  Chart reviewed, case discussed in multidisciplinary meeting, patient seen during rounds.  11/20: On interview today, patient is noted to be alert and oriented, calm and cooperative. He remains linear, logical, and future oriented. He will be going to [facility] upon discharge. He is able to discuss coping skills, crisis resources, and support system. He endorses depressive symptoms but denies anxiety. He continues to decline psychotropic medication at this time. He is willing to engage in outpatient psychiatric services and services offered through [facility]. He denies SI/HI/plan and denies hallucinations. He is agreeable to discharge tomorrow. He denies access to guns or other lethal means.   11/19: On interview today, patient is noted to be alert and oriented, calm and cooperative.  He is more engaged in interview with provider today.  Patient states he has been reflecting on past choices and future goals.  He states he is motivated to remain sober.  He has been accepted at [facility].  He remains discharged focused and future oriented.  He continues to decline psychotropic medication at this time.  He denies SI/HI/plan and denies hallucinations.  He denies current symptoms of depression or anxiety.  He has maintained safe behaviors on the unit.  He is able to discuss support system, coping skills, and crisis resources.  He continues to deny withdrawal symptoms.  He did not require any behavioral PRNs overnight.  He does not voice any concerns or complaints at this time.  11/18: On interview today, patient is noted to be  alert and oriented, irritable.  He is minimally engaged in interview.  He is working with social work team for placement at [facility].  He continues to be discharged focused and future oriented.  He denies SI/HI/plan and denies hallucinations.  He reports good sleep and appetite.  He denies current symptoms of depression or anxiety.  He continues to decline psychotropic medication at this time.  He denies withdrawal symptoms.  He did not require behavioral PRNs overnight.  11/17: Patient seen today for follow-up.  They indicate their goal is to get into long-term treatment that identify [facility] rescue mission.  Discussed plan to reach out to healing transitions which they would need to attend prior to going to [facility] rescue [facility].  He is irritable.  He denies ongoing withdrawal symptoms and has been refusing the Librium taper he is agreeable with continuing with symptom base scoring.  There is no tremor on exam.  He denies all symptoms of withdrawal on exam.  Blood pressures have been elevated however patient notes he has hypertension at baseline.  He denies SI, HI, AVH.  He reports stable mood appetite and sleep.  He did not require behavioral PRNs overnight.  He is minimally engaged in the progress interview for the most part giving 1-2 word answers.  He is not agreeable with starting psychotropic medications for depression or anxiety.  Past Psychiatric History: see h&P Family History: History reviewed. No pertinent family history. Social History:  Social History   Substance and Sexual Activity  Alcohol Use Yes   Alcohol/week: 114.0 standard drinks of alcohol   Types: 84  Cans of beer, 30 Shots of liquor per week   Comment: a pint of liquor and 12-24 pack of beer     Social History   Substance and Sexual Activity  Drug Use Yes   Types: Methamphetamines, Marijuana, Crack cocaine, Cocaine    Social History   Socioeconomic History   Marital status: Significant Other    Spouse name: Not on  file   Number of children: Not on file   Years of education: Not on file   Highest education level: Not on file  Occupational History   Not on file  Tobacco Use   Smoking status: Every Day    Types: E-cigarettes, Cigarettes   Smokeless tobacco: Never  Vaping Use   Vaping status: Former  Substance and Sexual Activity   Alcohol use: Yes    Alcohol/week: 114.0 standard drinks of alcohol    Types: 84 Cans of beer, 30 Shots of liquor per week    Comment: a pint of liquor and 12-24 pack of beer   Drug use: Yes    Types: Methamphetamines, Marijuana, Crack cocaine, Cocaine   Sexual activity: Yes    Partners: Female    Birth control/protection: Condom    Comment: Condoms sometimes  Other Topics Concern   Not on file  Social History Narrative   Not on file   Social Drivers of Health   Financial Resource Strain: Not on File (03/03/2023)   Received from [employer] [facility]    Financial Resource Strain: 0  Food Insecurity: Food Insecurity Present (04/25/2024)   Hunger Vital Sign    Worried About Running Out of Food in the Last Year: Sometimes true    Ran Out of Food in the Last Year: Sometimes true  Transportation Needs: Unmet Transportation Needs (04/25/2024)   PRAPARE - Administrator, Civil Service (Medical): Yes    Lack of Transportation (Non-Medical): Yes  Physical Activity: Not on File (03/03/2023)   Received from [facility]   Physical Activity    Physical Activity: 0  Stress: Not on File (03/03/2023)   Received from [facility]   Stress    Stress: 0  Social Connections: Not on File (03/03/2023)   Received from [facility]   Social Connections    Connectedness: 0   Past Medical History: History reviewed. No pertinent past medical history. History reviewed. No pertinent surgical history.  Current Medications: Current Facility-Administered Medications  Medication Dose Route Frequency Provider Last Rate Last Admin   acetaminophen  (TYLENOL ) tablet 650 mg  650 mg  Oral Q6H PRN Montague, Gerardo Caiazzo J, NP       alum & mag hydroxide-simeth (MAALOX/MYLANTA) 200-200-20 MG/5ML suspension 30 mL  30 mL Oral Q4H PRN Montague, Elverta Dimiceli J, NP       alum & mag hydroxide-simeth (MAALOX/MYLANTA) 200-200-20 MG/5ML suspension 30 mL  30 mL Oral Q4H PRN Montague, Glenora Morocho J, NP       haloperidol  (HALDOL ) tablet 5 mg  5 mg Oral TID PRN Montague, Merril Isakson J, NP       And   diphenhydrAMINE  (BENADRYL ) capsule 50 mg  50 mg Oral TID PRN Montague, Marlo Goodrich J, NP       haloperidol  lactate (HALDOL ) injection 5 mg  5 mg Intramuscular TID PRN Montague, Ronny Korff J, NP       And   diphenhydrAMINE  (BENADRYL ) injection 50 mg  50 mg Intramuscular TID PRN Montague, Mandela Bello J, NP       And   LORazepam  (ATIVAN ) injection 2 mg  2 mg Intramuscular TID PRN Montague, Hasana Alcorta J, NP       haloperidol  lactate (HALDOL ) injection 10 mg  10 mg Intramuscular TID PRN Montague, Damein Gaunce J, NP       And   diphenhydrAMINE  (BENADRYL ) injection 50 mg  50 mg Intramuscular TID PRN Montague, Santos Hardwick J, NP       And   LORazepam  (ATIVAN ) injection 2 mg  2 mg Intramuscular TID PRN Montague, Geselle Cardosa J, NP       hydrOXYzine  (ATARAX ) tablet 25 mg  25 mg Oral TID PRN Montague, Ember Gottwald J, NP       magnesium  hydroxide (MILK OF MAGNESIA) suspension 30 mL  30 mL Oral Daily PRN Montague, Klye Besecker J, NP       multivitamin with minerals tablet 1 tablet  1 tablet Oral Daily May, Tanya, NP   1 tablet at 04/30/24 9275   nicotine (NICODERM CQ - dosed in mg/24 hours) patch 21 mg  21 mg Transdermal Q0600 Montague, Miroslava Santellan J, NP       nicotine polacrilex (NICORETTE) gum 2 mg  2 mg Oral PRN Jadapalle, Sree, MD   2 mg at 04/30/24 1214   thiamine (VITAMIN B1) tablet 100 mg  100 mg Oral Daily May, Tanya, NP   100 mg at 04/30/24 9275   traZODone  (DESYREL ) tablet 50 mg  50 mg Oral QHS PRN Montague, Tian Davison J, NP        Lab Results:  No results found for this or any previous visit (from the past 48 hours).   Blood Alcohol level:  Lab  Results  Component Value Date   [facility] <15 04/26/2024   ETH <10 06/17/2021    Metabolic Disorder Labs: Lab Results  Component Value Date   HGBA1C 6.3 (H) 02/04/2021   MPG 134.11 02/04/2021   No results found for: PROLACTIN Lab Results  Component Value Date   CHOL 145 02/04/2021   TRIG 98 02/04/2021   HDL 55 02/04/2021   CHOLHDL 2.6 02/04/2021   VLDL 20 02/04/2021   LDLCALC 70 02/04/2021    Physical Findings: AIMS:  , ,  ,  ,    CIWA:  CIWA-Ar Total: 2 COWS:      Psychiatric Specialty Exam:  Presentation  General Appearance:  Appropriate for Environment  Eye Contact: Fair  Speech: Clear and Coherent  Speech Volume: Normal    Mood and Affect  Mood: Depressed  Affect: Appropriate   Thought Process  Thought Processes: Coherent; Linear  Orientation:Full (Time, Place and Person)  Thought Content:Logical  Hallucinations: None  Ideas of Reference:None  Suicidal Thoughts: Denies  Homicidal Thoughts: Denies   Sensorium  Memory: Immediate Good; Recent Good  Judgment: Fair  Insight: Fair  Art Therapist  Concentration: Fair  Attention Span: Fair  Recall: Good  Fund of Knowledge: Fair  Language: Fair   Psychomotor Activity  Psychomotor Activity: Normal  Musculoskeletal: Strength & Muscle Tone: within normal limits Gait & Station: normal Assets  Assets: Manufacturing Systems Engineer; Desire for Improvement; Social Support    Physical Exam: Physical Exam Vitals and nursing note reviewed.  HENT:     Head: Atraumatic.  Eyes:     Extraocular Movements: Extraocular movements intact.  Pulmonary:     Effort: Pulmonary effort is normal.  Neurological:     Mental Status: He is alert and oriented to person, place, and time.  Psychiatric:        Attention and Perception: Attention and perception normal. He does not perceive auditory or visual  hallucinations.        Mood and Affect: Mood is depressed.        Speech: Speech  normal.        Behavior: Behavior is cooperative.        Thought Content: Thought content does not include homicidal or suicidal ideation. Thought content does not include homicidal or suicidal plan.        Cognition and Memory: Cognition normal.    Review of Systems  Neurological:  Negative for tremors.  Psychiatric/Behavioral:  Positive for depression and substance abuse. Negative for hallucinations and suicidal ideas. The patient is not nervous/anxious and does not have insomnia.    Blood pressure 134/80, pulse 91, temperature 97.8 F (36.6 C), temperature source Oral, resp. rate 20, height 5' 8 (1.727 m), weight 87.1 kg, SpO2 98%. Body mass index is 29.19 kg/m.  Diagnosis: Principal Problem:   Substance induced mood disorder (HCC) Active Problems:   Suicidal ideations   PLAN: Safety and Monitoring:  -- Voluntary admission to inpatient psychiatric unit for safety, stabilization and treatment  -- Daily contact with patient to assess and evaluate symptoms and progress in treatment  -- Patient's case to be discussed in multi-disciplinary team meeting  -- Observation Level : q15 minute checks  -- Vital signs:  q12 hours  -- Precautions: suicide, elopement, and assault -- Encouraged patient to participate in unit milieu and in scheduled group therapies  2. Psychiatric Treatment:  Patient declining all medication at this time - patient does not meet criteria for nonemergent enforced medication at this time.  Librium withdrawal protocol Can consider Prozac  and Abilify  as this seemed effective during 2022 admission to [facility].      -- The risks/benefits/side-effects/alternatives to this medication were discussed in detail with the patient and time was given for questions. The patient consents to medication trial.                -- Metabolic profile and EKG monitoring obtained while on an atypical antipsychotic (BMI: Lipid Panel: HbgA1c: QTc:)              -- Encouraged patient to  participate in unit milieu and in scheduled group therapies                3. Medical Issues Being Addressed:  No acute concerns   4. Discharge Planning:             -- Friday  -- Social work and case management to assist with discharge planning and identification of hospital follow-up needs prior to discharge  -- Estimated LOS: 5-7 days  [employer], PA-C 04/30/2024, 4:11 PM

## 2024-04-30 NOTE — Progress Notes (Signed)
   04/30/24 0730  Psych Admission Type (Psych Patients Only)  Admission Status Voluntary  Psychosocial Assessment  Patient Complaints None  Eye Contact Brief  Facial Expression Anxious  Affect Appropriate to circumstance  Speech Logical/coherent  Interaction Seclusive  Motor Activity Slow  Appearance/Hygiene Unremarkable  Behavior Characteristics Cooperative;Appropriate to situation  Mood Anxious;Depressed  Aggressive Behavior  Effect No apparent injury  Thought Process  Coherency WDL  Content WDL  Delusions None reported or observed  Perception WDL  Hallucination None reported or observed  Judgment Limited  Confusion None  Danger to Self  Current suicidal ideation? Denies  Agreement Not to Harm Self Yes  Description of Agreement Verbal  Danger to Others  Danger to Others None reported or observed

## 2024-04-30 NOTE — Plan of Care (Signed)

## 2024-04-30 NOTE — Group Note (Signed)
 Recreation Therapy Group Note   Group Topic:Stress Management  Group Date: 04/30/2024 Start Time: 1000 End Time: 1040 Facilitators: Celestia Jeoffrey BRAVO, LRT, CTRS Location: Dayroom  Group Description: PMR (Progressive Muscle Relaxation). LRT educates patients on what PMR is and the benefits that come from it. Patients are asked to sit with their feet flat on the floor while sitting up and all the way back in their chair, if possible. LRT and pts follow a prompt through a speaker that requires you to tense and release different muscles in their body and focus on their breathing. During session, lights are off and soft music is being played. Pts are given a stress ball to use if needed.   Goal Area(s) Addressed:  Patients will be able to describe progressive muscle relaxation.  Patient will practice using relaxation technique. Patient will identify a new coping skill.  Patient will follow multistep directions to reduce anxiety and stress.   Affect/Mood: N/A   Participation Level: Did not attend    Clinical Observations/Individualized Feedback: Patient did not attend group.   Plan: Continue to engage patient in RT group sessions 2-3x/week.   Jeoffrey BRAVO Celestia, LRT, CTRS 04/30/2024 11:41 AM

## 2024-05-01 NOTE — Plan of Care (Signed)
 Kenneth Yang is a 58 y.o. male patient. No diagnosis found. History reviewed. No pertinent past medical history. Current Facility-Administered Medications  Medication Dose Route Frequency Provider Last Rate Last Admin   acetaminophen  (TYLENOL ) tablet 650 mg  650 mg Oral Q6H PRN Montague, Crystal J, NP       alum & mag hydroxide-simeth (MAALOX/MYLANTA) 200-200-20 MG/5ML suspension 30 mL  30 mL Oral Q4H PRN Montague, Crystal J, NP       alum & mag hydroxide-simeth (MAALOX/MYLANTA) 200-200-20 MG/5ML suspension 30 mL  30 mL Oral Q4H PRN Montague, Crystal J, NP       haloperidol  (HALDOL ) tablet 5 mg  5 mg Oral TID PRN Montague, Crystal J, NP       And   diphenhydrAMINE  (BENADRYL ) capsule 50 mg  50 mg Oral TID PRN Montague, Crystal J, NP       haloperidol  lactate (HALDOL ) injection 5 mg  5 mg Intramuscular TID PRN Montague, Crystal J, NP       And   diphenhydrAMINE  (BENADRYL ) injection 50 mg  50 mg Intramuscular TID PRN Montague, Crystal J, NP       And   LORazepam  (ATIVAN ) injection 2 mg  2 mg Intramuscular TID PRN Montague, Crystal J, NP       haloperidol  lactate (HALDOL ) injection 10 mg  10 mg Intramuscular TID PRN Montague, Crystal J, NP       And   diphenhydrAMINE  (BENADRYL ) injection 50 mg  50 mg Intramuscular TID PRN Montague, Crystal J, NP       And   LORazepam  (ATIVAN ) injection 2 mg  2 mg Intramuscular TID PRN Montague, Crystal J, NP       hydrOXYzine  (ATARAX ) tablet 25 mg  25 mg Oral TID PRN Montague, Crystal J, NP       magnesium  hydroxide (MILK OF MAGNESIA) suspension 30 mL  30 mL Oral Daily PRN Montague, Crystal J, NP       multivitamin with minerals tablet 1 tablet  1 tablet Oral Daily May, Tanya, NP   1 tablet at 04/30/24 9275   nicotine  (NICODERM CQ  - dosed in mg/24 hours) patch 21 mg  21 mg Transdermal Q0600 Montague, Crystal J, NP       nicotine  polacrilex (NICORETTE ) gum 2 mg  2 mg Oral PRN Jadapalle, Sree, MD   2 mg at 04/30/24 1214   thiamine  (VITAMIN B1) tablet 100 mg   100 mg Oral Daily May, Tanya, NP   100 mg at 04/30/24 9275   traZODone  (DESYREL ) tablet 50 mg  50 mg Oral QHS PRN Montague, Crystal J, NP       Allergies  Allergen Reactions   Risperidone Other (See Comments)    Tongue EPS, drooling   Principal Problem:   Substance induced mood disorder (HCC) Active Problems:   Suicidal ideations  Blood pressure (!) 135/92, pulse 90, temperature 97.8 F (36.6 C), temperature source Oral, resp. rate 18, height 5' 8 (1.727 m), weight 87.1 kg, SpO2 98%.    Kenneth Yang Kenneth Yang 05/01/2024

## 2024-05-01 NOTE — Progress Notes (Signed)
  [facility] Adult Case Management Discharge Plan :  Will you be returning to the same living situation after discharge:  No. Patient is attending treatment with [facility].  At discharge, do you have transportation home?: Yes,  CSW has arranged taxi services on patient's behalf.  Do you have the ability to pay for your medications: Yes,  [facility]  Release of information consent forms completed and in the chart;  Patient's signature needed at discharge.  Patient to Follow up at:  Follow-up Information     [facility] Follow up.   Why: Therapy and psychiatric care will be arranged for you within two weeks of your admission. Contact information: [address] [place], [place] [address]  Phone: [phone] Fax: [phone]                Next level of care provider has access to [facility] Link:no  Safety Planning and Suicide Prevention discussed: Yes, SPE completed with pt, as pt refused to consent to family contact. SPI pamphlet provided to pt and pt was encouraged to share information with support network, ask questions, and talk about any concerns relating to SPE. Pt denies access to guns/firearms and verbalized understanding of information provided. Mobile Crisis information also provided to pt.       Has patient been referred to the Quitline?: Patient refused referral for treatment  Patient has been referred for addiction treatment: Yes, the patient will follow up with an outpatient provider for substance use disorder. Psychiatrist/APP: appointment made and Therapist: appointment made  Alveta CHRISTELLA Kerns, LCSW 05/01/2024, 8:34 AM

## 2024-05-01 NOTE — Progress Notes (Signed)
 Patient denies SI/HI/AVH at this time. Discharge instructions, AVS, prescriptions, and transition record reviewed with patient. Patient agrees to comply with medication management, follow-up visit and outpatient therapy. Patient belongings returned to patient. Patient questions and concerns addressed and answered.  Patient ambulatory off unit. Patient discharged via Taxi to [facility].

## 2024-05-01 NOTE — Discharge Summary (Signed)
 Physician Discharge Summary Note  Patient:  Kenneth Yang is an 58 y.o., male MRN:  [id number] DOB:  1965/10/21 Patient phone:  [phone] (home)  Patient address:   [address] [place] [address],   Total time spent: 40 min Date of Admission:  04/25/2024 Date of Discharge: 05/01/24  Reason for Admission:  SI and polysubstance use  Principal Problem: Substance induced mood disorder (HCC) Discharge Diagnoses: Principal Problem:   Substance induced mood disorder (HCC) Active Problems:   Suicidal ideations   Past Psychiatric History: see h&p  Family Psychiatric  History: see h&p Social History:  Social History   Substance and Sexual Activity  Alcohol Use Yes   Alcohol/week: 114.0 standard drinks of alcohol   Types: 84 Cans of beer, 30 Shots of liquor per week   Comment: a pint of liquor and 12-24 pack of beer     Social History   Substance and Sexual Activity  Drug Use Yes   Types: Methamphetamines, Marijuana, Crack cocaine, Cocaine    Social History   Socioeconomic History   Marital status: Significant Other    Spouse name: Not on file   Number of children: Not on file   Years of education: Not on file   Highest education level: Not on file  Occupational History   Not on file  Tobacco Use   Smoking status: Every Day    Types: E-cigarettes, Cigarettes   Smokeless tobacco: Never  Vaping Use   Vaping status: Former  Substance and Sexual Activity   Alcohol use: Yes    Alcohol/week: 114.0 standard drinks of alcohol    Types: 84 Cans of beer, 30 Shots of liquor per week    Comment: a pint of liquor and 12-24 pack of beer   Drug use: Yes    Types: Methamphetamines, Marijuana, Crack cocaine, Cocaine   Sexual activity: Yes    Partners: Female    Birth control/protection: Condom    Comment: Condoms sometimes  Other Topics Concern   Not on file  Social History Narrative   Not on file   Social Drivers of Health   Financial Resource  Strain: Not on File (03/03/2023)   Received from [facility]    Financial Resource Strain: 0  Food Insecurity: Food Insecurity Present (04/25/2024)   Hunger Vital Sign    Worried About Running Out of Food in the Last Year: Sometimes true    Ran Out of Food in the Last Year: Sometimes true  Transportation Needs: Unmet Transportation Needs (04/25/2024)   PRAPARE - Administrator, Civil Service (Medical): Yes    Lack of Transportation (Non-Medical): Yes  Physical Activity: Not on File (03/03/2023)   Received from [facility]   Physical Activity    Physical Activity: 0  Stress: Not on File (03/03/2023)   Received from [facility]   Stress    Stress: 0  Social Connections: Not on File (03/03/2023)   Received from [facility]   Social Connections    Connectedness: 0   Past Medical History: History reviewed. No pertinent past medical history. History reviewed. No pertinent surgical history. Family History: History reviewed. No pertinent family history.  Hospital Course:    The patient is a 58 year old male with a psychiatric history significant for cocaine use disorder, cocaine-induced psychosis, polysubstance abuse, and chronic alcohol use who presented from a homeless shelter with chest pain and endorsed suicidal ideation. UDS was positive for cocaine, BAL 0. He reported daily crack  cocaine use since early September 2025 and chronic alcohol use but provided inconsistent details. On admission, he was irritable, minimally engaged, and declined medications including the Librium  withdrawal protocol despite reporting heavy alcohol use. Admitted for safety, stabilization, diagnostic clarification, and discharge planning.  Early in admission, the patient remained irritable and minimally engaged. He provided short responses, resisted discussing substance use history, and refused psychotropic medications. He denied SI/HI/AVH, denied withdrawal symptoms, and exhibited no tremor or  objective signs of withdrawal. He was focused on securing long-term residential treatment and expressed the goal of going to [facility] and then [facility]. He remained safe on the unit and did not require behavioral PRNs.  Mid-admission, the patient demonstrated improved engagement during interviews, remained calm and cooperative, and was increasingly future-oriented. He denied SI/HI/AVH, denied depressive or anxiety symptoms, and maintained stable sleep and appetite. He participated appropriately with social work and was accepted to [facility]. He was able to discuss coping skills, crisis resources, and his motivation for sobriety. He continued to decline psychotropic medications but reported stable mood and no withdrawal symptoms. Vital signs remained stable overall, and no acute medical concerns were identified.  Later in admission, the patient remained linear, logical, and increasingly reflective on past substance use and future goals. He consistently denied SI/HI/plan and denied hallucinations. He endorsed mild depressive symptoms but indicated he felt safe and supported by the discharge plan. He continued to decline medication but was able to engage in discussions about relapse prevention, coping skills, crisis planning, and substance-use treatment adherence. He remained free of behavioral concerns, participated in safety planning, and denied access to firearms or other lethal means.  By the time of discharge planning, he remained calm, cooperative, and appropriately goal-directed. He verbalized understanding of the plan to discharge to [facility] and expressed optimism and motivation regarding residential treatment.  On the day of discharge, patient denies SI/HI/AVH. They are linear on exam, they are optimistic to be going to residential treatment. He is agreeable with discharge plan and voices no safety concerns. Safety Planning and Suicide  Prevention discussed: Yes, SPE completed with patient. They are aware of crisis resources, and they contract for safety.  A detailed risk assessment is complete based on clinical exam and individual risk factors, and acute suicide risk is low and acute violence risk is low.  Currently, all modifiable risk of harm to self or others have been addressed and the patient is no longer appropriate for the acute inpatient setting. He is able to continue treatment for mental health and substance use needs in the community with the supports as indicated below. Patient is educated and verbalized understanding of discharge plan of care including medications, follow-up appointments, mental health resources, and crisis services in the community. He is instructed to call 911 or present to the nearest emergency room should he experience any decompensation in mood or return of suicidal or homicidal ideations. Patient verbalizes understanding of this education and agrees to this plan of care.  Physical Findings: AIMS:  , ,  ,  ,    CIWA:  CIWA-Ar Total: 0 COWS:      Psychiatric Specialty Exam:  Presentation  General Appearance:  Appropriate for Environment  Eye Contact: Fair  Speech: Clear and Coherent  Speech Volume: Normal    Mood and Affect  Mood: Depressed  Affect: Appropriate   Thought Process  Thought Processes: Coherent; Linear  Descriptions of Associations:Intact  Orientation:Full (Time, Place and Person)  Thought  Content:Logical  Hallucinations:Hallucinations: None  Ideas of Reference:None  Suicidal Thoughts:Suicidal Thoughts: No  Homicidal Thoughts:Homicidal Thoughts: No   Sensorium  Memory: Immediate Good; Recent Good  Judgment: Fair  Insight: Fair   Art Therapist  Concentration: Fair  Attention Span: Fair  Recall: Good  Fund of Knowledge: Fair  Language: Fair   Psychomotor Activity  Psychomotor Activity: Psychomotor Activity:  Normal  Musculoskeletal: Strength & Muscle Tone: within normal limits Gait & Station: normal Assets  Assets: Manufacturing Systems Engineer; Desire for Improvement; Social Support   Sleep  Sleep: Sleep: Good    Physical Exam: Physical Exam Vitals and nursing note reviewed.  HENT:     Head: Atraumatic.  Eyes:     Extraocular Movements: Extraocular movements intact.  Pulmonary:     Effort: Pulmonary effort is normal.  Neurological:     Mental Status: He is alert and oriented to person, place, and time.  Psychiatric:        Mood and Affect: Mood normal.        Behavior: Behavior normal.    Review of Systems  Psychiatric/Behavioral:  Negative for depression, hallucinations, memory loss, substance abuse and suicidal ideas. The patient is not nervous/anxious and does not have insomnia.    Blood pressure 117/72, pulse 72, temperature 98.2 F (36.8 C), temperature source Oral, resp. rate 18, height 5' 8 (1.727 m), weight 87.1 kg, SpO2 97%. Body mass index is 29.19 kg/m.   Social History   Tobacco Use  Smoking Status Every Day   Types: E-cigarettes, Cigarettes  Smokeless Tobacco Never   Tobacco Cessation:  A prescription for an FDA-approved tobacco cessation medication was offered at discharge and the patient refused   Blood Alcohol level:  Lab Results  Component Value Date   [facility] <15 04/26/2024   ETH <10 06/17/2021    Metabolic Disorder Labs:  Lab Results  Component Value Date   HGBA1C 6.3 (H) 02/04/2021   MPG 134.11 02/04/2021   No results found for: PROLACTIN Lab Results  Component Value Date   CHOL 145 02/04/2021   TRIG 98 02/04/2021   HDL 55 02/04/2021   CHOLHDL 2.6 02/04/2021   VLDL 20 02/04/2021   LDLCALC 70 02/04/2021    See Psychiatric Specialty Exam and Suicide Risk Assessment completed by Attending Physician prior to discharge.  Discharge destination:  Other:  Treatment program  Is patient on multiple antipsychotic therapies at discharge:  No    Has Patient had three or more failed trials of antipsychotic monotherapy by history:  No  Recommended Plan for Multiple Antipsychotic Therapies: NA  Discharge Instructions     Diet - low sodium heart healthy   Complete by: As directed    Increase activity slowly   Complete by: As directed       Allergies as of 05/01/2024       Reactions   Risperidone Other (See Comments)   Tongue EPS, drooling        Medication List     TAKE these medications      Indication  multivitamin with minerals Tabs tablet Take 1 tablet by mouth daily.  Indication: Vitamin Deficiency        Follow-up Information     [facility] Follow up.   Why: Therapy and psychiatric care will be arranged for you within two weeks of your admission. Contact information: [address], [address] [place], [place] [address]  Phone: [phone] Fax: [phone]  Follow-up recommendations:   # It is recommended to the patient to continue psychiatric medications as prescribed, after discharge from the hospital.   # It is recommended to the patient to follow up with your outpatient psychiatric provider and PCP. # It was discussed with the patient, the impact of alcohol, drugs, tobacco have been there overall psychiatric and medical wellbeing, and total abstinence from substance use was recommended. # Prescriptions provided or sent directly to preferred pharmacy at discharge. Patient agreeable to plan. Given the opportunity to ask questions. Appears to feel comfortable with discharge.  # In the event of worsening symptoms, the patient is instructed to call the crisis hotline (988), 911 and or go to the nearest ED for appropriate evaluation and treatment of symptoms. To follow-up with primary care provider for other medical issues, concerns and or health care needs # Patient was discharged  as requested with a plan to follow up as noted above.     I have reviewed  this case with Dr. Jadapalle who is agreeable with this plan.  Signed: Donnice FORBES Right, PA-C 05/01/2024, 9:06 AM
